# Patient Record
Sex: Female | Born: 1950 | Race: White | Hispanic: No | Marital: Married | Smoking: Current every day smoker
Health system: Southern US, Community
[De-identification: ages and names within clinical notes are randomized; demographics above are authoritative.]

## PROBLEM LIST (undated history)

## (undated) DIAGNOSIS — C801 Malignant (primary) neoplasm, unspecified: Secondary | ICD-10-CM

## (undated) HISTORY — PX: BREAST SURGERY: SHX581

---

## 2012-09-06 ENCOUNTER — Encounter (HOSPITAL_COMMUNITY): Payer: Self-pay | Admitting: Emergency Medicine

## 2012-09-06 ENCOUNTER — Emergency Department (HOSPITAL_COMMUNITY)
Admission: EM | Admit: 2012-09-06 | Discharge: 2012-09-06 | Disposition: A | Discharge status: Home / Self Care | Payer: Self-pay | Attending: Emergency Medicine | Admitting: Emergency Medicine

## 2012-09-06 DIAGNOSIS — G479 Sleep disorder, unspecified: Secondary | ICD-10-CM | POA: Insufficient documentation

## 2012-09-06 DIAGNOSIS — G44209 Tension-type headache, unspecified, not intractable: Secondary | ICD-10-CM | POA: Insufficient documentation

## 2012-09-06 DIAGNOSIS — F17200 Nicotine dependence, unspecified, uncomplicated: Secondary | ICD-10-CM | POA: Insufficient documentation

## 2012-09-06 DIAGNOSIS — B369 Superficial mycosis, unspecified: Secondary | ICD-10-CM | POA: Insufficient documentation

## 2012-09-06 DIAGNOSIS — R5383 Other fatigue: Secondary | ICD-10-CM | POA: Insufficient documentation

## 2012-09-06 DIAGNOSIS — F419 Anxiety disorder, unspecified: Secondary | ICD-10-CM

## 2012-09-06 DIAGNOSIS — F410 Panic disorder [episodic paroxysmal anxiety] without agoraphobia: Secondary | ICD-10-CM | POA: Insufficient documentation

## 2012-09-06 DIAGNOSIS — R5381 Other malaise: Secondary | ICD-10-CM | POA: Insufficient documentation

## 2012-09-06 MED ORDER — NYSTATIN 100000 UNIT/GM EX CREA: TOPICAL_CREAM | CUTANEOUS | Status: DC

## 2012-09-06 MED ORDER — CLONAZEPAM 0.5 MG PO TABS
0.5000 mg | ORAL_TABLET | Freq: Once | ORAL | Status: AC
  Administered 2012-09-06: 0.5 mg via ORAL
  Filled 2012-09-06: qty 1

## 2012-09-06 MED ORDER — CLONAZEPAM 0.5 MG PO TABS: 0.5000 mg | ORAL_TABLET | Freq: Two times a day (BID) | ORAL | Status: AC | PRN

## 2012-09-06 NOTE — ED Provider Notes (Signed)
Medical screening examination/treatment/procedure(s) were performed by non-physician practitioner and as supervising physician I was immediately available for consultation/collaboration.  Gilda Crease, MD 09/06/12 2022

## 2012-09-06 NOTE — ED Notes (Signed)
Pt to ED tearful, st's she hasn't slept in 3 nights.  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  Has not been eating.  Pt also has a growth on left breast that has been draining foul smelling drainage.  St's she has not been to the doctor because she doesn't have insurance.  Pt st's she feels like she is having a nervous breakdown. St's all she does is cry.

## 2012-09-06 NOTE — ED Provider Notes (Signed)
History     CSN: [id number]  Arrival date & time 09/06/12  1632   First MD Initiated Contact with Patient 09/06/12 1823      Chief Complaint  Patient presents with  . Anxiety    (Consider location/radiation/quality/duration/timing/severity/associated sxs/prior treatment) HPI Comments: 62 y/o female presents to the ED with one of her daughters complaining of worsening anxiety and insomnia since Christmas eve. Has not slept for last 3 days. 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 Complains of "tension all over", stomach in "knots", lack of appetite, frequent crying, and tension headaches. Reported 6 pound weight loss over the last 2 months.  Patient did text her daughter today that she could not go on living with the insomnia, but had no apparent plan to harm herself or others.  Denies thoughts of suicide, states she is just overwhelmed with lift. Patient has been taking Z-quil, a half dose nightly with no relief. She does not have a PCP due to insurance reasons. Has never been diagnosed with anxiety in the past. Also complaining of a rash under her right breast that has been present on and off for many years. She never had this checked out. States it is occasionally moist, but does not irritate her or itch.  Patient is a 62 y.o. female presenting with anxiety. The history is provided by the patient and a relative.  Anxiety Associated symptoms include fatigue and a rash.    History reviewed. No pertinent past medical history.  Past Surgical History  Procedure Laterality Date  . Breast surgery      No family history on file.  History  Substance Use Topics  . Smoking status: Current Every Day Smoker  . Smokeless tobacco: Not on file  . Alcohol Use: No    OB History   Grav Para Term Preterm Abortions TAB SAB Ect Mult Living                  Review of Systems  Constitutional: Positive for activity change, appetite change  and fatigue.  Skin: Positive for rash.  Psychiatric/Behavioral: Positive for sleep disturbance. Negative for suicidal ideas and self-injury. The patient is nervous/anxious.   All other systems reviewed and are negative.    Allergies  Review of patient's allergies indicates no known allergies.  Home Medications  No current outpatient prescriptions on file.  BP 159/94  Pulse 97  Temp(Src) 98.4 F (36.9 C) (Oral)  Resp 22  SpO2 98%  Physical Exam  Nursing note and vitals reviewed. Constitutional: She is oriented to person, place, and time. She appears well-developed and well-nourished. No distress.  HENT:  Head: Normocephalic and atraumatic.  Mouth/Throat: Oropharynx is clear and moist.  Eyes: Conjunctivae and EOM are normal. Pupils are equal, round, and reactive to light.  Neck: Normal range of motion. Neck supple.  Cardiovascular: Regular rhythm, normal heart sounds and intact distal pulses.  Tachycardia present.   Pulmonary/Chest: Effort normal and breath sounds normal. No respiratory distress.  Abdominal: Soft. Bowel sounds are normal. There is no tenderness.  Musculoskeletal: Normal range of motion. She exhibits no edema.  Neurological: She is alert and oriented to person, place, and time.  Skin: Skin is warm and dry.     Psychiatric: Her speech is normal and behavior is normal. Judgment normal. Her mood appears anxious. Cognition and memory are normal. She exhibits a depressed mood. She expresses no homicidal and no suicidal ideation. She expresses no  suicidal plans and no homicidal plans.    ED Course  Procedures (including critical care time)  Labs Reviewed - No data to display No results found.   1. Anxiety   2. Panic attack   3. Fungal infection of skin       MDM  62 y/o female with anxiety and panic attack. She is not suicidal or homicidal. Rx klonopin and she will f/u with PCP on resource guide. Daughter states she will make sure patient calls a clinic.  Rx nystatin cream for rash under breast. Return precautions discussed. Patient stable for discharge. Patient and daughter state understanding of plan and are agreeable.        Trevor Mace, PA-C 09/06/12 [phone]

## 2012-09-06 NOTE — ED Notes (Signed)
Pt undressed, in gown, on monitor, continuous pulse oximetry and blood pressure cuff; family at bedside 

## 2016-05-12 DIAGNOSIS — F064 Anxiety disorder due to known physiological condition: Secondary | ICD-10-CM | POA: Diagnosis not present

## 2016-05-12 DIAGNOSIS — Z79899 Other long term (current) drug therapy: Secondary | ICD-10-CM | POA: Diagnosis not present

## 2016-05-12 DIAGNOSIS — Z6823 Body mass index (BMI) 23.0-23.9, adult: Secondary | ICD-10-CM | POA: Diagnosis not present

## 2016-05-12 DIAGNOSIS — R030 Elevated blood-pressure reading, without diagnosis of hypertension: Secondary | ICD-10-CM | POA: Diagnosis not present

## 2016-06-05 DIAGNOSIS — Z6823 Body mass index (BMI) 23.0-23.9, adult: Secondary | ICD-10-CM | POA: Diagnosis not present

## 2016-06-05 DIAGNOSIS — F064 Anxiety disorder due to known physiological condition: Secondary | ICD-10-CM | POA: Diagnosis not present

## 2016-06-05 DIAGNOSIS — R030 Elevated blood-pressure reading, without diagnosis of hypertension: Secondary | ICD-10-CM | POA: Diagnosis not present

## 2016-07-07 DIAGNOSIS — F064 Anxiety disorder due to known physiological condition: Secondary | ICD-10-CM | POA: Diagnosis not present

## 2016-08-11 DIAGNOSIS — B373 Candidiasis of vulva and vagina: Secondary | ICD-10-CM | POA: Diagnosis not present

## 2016-08-18 DIAGNOSIS — D242 Benign neoplasm of left breast: Secondary | ICD-10-CM | POA: Diagnosis not present

## 2016-08-18 DIAGNOSIS — Z803 Family history of malignant neoplasm of breast: Secondary | ICD-10-CM | POA: Diagnosis not present

## 2016-08-18 DIAGNOSIS — N6322 Unspecified lump in the left breast, upper inner quadrant: Secondary | ICD-10-CM | POA: Diagnosis not present

## 2016-08-25 DIAGNOSIS — C44509 Unspecified malignant neoplasm of skin of other part of trunk: Secondary | ICD-10-CM | POA: Diagnosis not present

## 2016-08-25 DIAGNOSIS — D485 Neoplasm of uncertain behavior of skin: Secondary | ICD-10-CM | POA: Diagnosis not present

## 2016-10-06 DIAGNOSIS — Z0000 Encounter for general adult medical examination without abnormal findings: Secondary | ICD-10-CM | POA: Diagnosis not present

## 2016-10-06 DIAGNOSIS — Z136 Encounter for screening for cardiovascular disorders: Secondary | ICD-10-CM | POA: Diagnosis not present

## 2016-10-06 DIAGNOSIS — Z131 Encounter for screening for diabetes mellitus: Secondary | ICD-10-CM | POA: Diagnosis not present

## 2016-10-06 DIAGNOSIS — Z7689 Persons encountering health services in other specified circumstances: Secondary | ICD-10-CM | POA: Diagnosis not present

## 2016-10-06 DIAGNOSIS — I10 Essential (primary) hypertension: Secondary | ICD-10-CM | POA: Diagnosis not present

## 2016-10-06 DIAGNOSIS — Z720 Tobacco use: Secondary | ICD-10-CM | POA: Diagnosis not present

## 2016-10-06 DIAGNOSIS — Z5181 Encounter for therapeutic drug level monitoring: Secondary | ICD-10-CM | POA: Diagnosis not present

## 2016-10-06 DIAGNOSIS — R69 Illness, unspecified: Secondary | ICD-10-CM | POA: Diagnosis not present

## 2016-10-20 DIAGNOSIS — Z720 Tobacco use: Secondary | ICD-10-CM | POA: Diagnosis not present

## 2016-10-20 DIAGNOSIS — Z0000 Encounter for general adult medical examination without abnormal findings: Secondary | ICD-10-CM | POA: Diagnosis not present

## 2016-10-20 DIAGNOSIS — R69 Illness, unspecified: Secondary | ICD-10-CM | POA: Diagnosis not present

## 2016-10-20 DIAGNOSIS — I10 Essential (primary) hypertension: Secondary | ICD-10-CM | POA: Diagnosis not present

## 2016-10-20 DIAGNOSIS — R7303 Prediabetes: Secondary | ICD-10-CM | POA: Diagnosis not present

## 2016-12-21 DIAGNOSIS — L309 Dermatitis, unspecified: Secondary | ICD-10-CM | POA: Diagnosis not present

## 2016-12-21 DIAGNOSIS — Z720 Tobacco use: Secondary | ICD-10-CM | POA: Diagnosis not present

## 2016-12-21 DIAGNOSIS — I10 Essential (primary) hypertension: Secondary | ICD-10-CM | POA: Diagnosis not present

## 2016-12-21 DIAGNOSIS — R69 Illness, unspecified: Secondary | ICD-10-CM | POA: Diagnosis not present

## 2017-03-05 DIAGNOSIS — I10 Essential (primary) hypertension: Secondary | ICD-10-CM | POA: Diagnosis not present

## 2017-03-05 DIAGNOSIS — R69 Illness, unspecified: Secondary | ICD-10-CM | POA: Diagnosis not present

## 2017-03-05 DIAGNOSIS — Z720 Tobacco use: Secondary | ICD-10-CM | POA: Diagnosis not present

## 2017-06-04 DIAGNOSIS — I10 Essential (primary) hypertension: Secondary | ICD-10-CM | POA: Diagnosis not present

## 2017-06-04 DIAGNOSIS — H6692 Otitis media, unspecified, left ear: Secondary | ICD-10-CM | POA: Diagnosis not present

## 2017-06-04 DIAGNOSIS — Z720 Tobacco use: Secondary | ICD-10-CM | POA: Diagnosis not present

## 2017-06-04 DIAGNOSIS — Z0000 Encounter for general adult medical examination without abnormal findings: Secondary | ICD-10-CM | POA: Diagnosis not present

## 2017-06-04 DIAGNOSIS — Z1389 Encounter for screening for other disorder: Secondary | ICD-10-CM | POA: Diagnosis not present

## 2017-06-04 DIAGNOSIS — H539 Unspecified visual disturbance: Secondary | ICD-10-CM | POA: Diagnosis not present

## 2017-06-04 DIAGNOSIS — R69 Illness, unspecified: Secondary | ICD-10-CM | POA: Diagnosis not present

## 2017-06-04 DIAGNOSIS — Z131 Encounter for screening for diabetes mellitus: Secondary | ICD-10-CM | POA: Diagnosis not present

## 2017-06-04 DIAGNOSIS — Z0110 Encounter for examination of ears and hearing without abnormal findings: Secondary | ICD-10-CM | POA: Diagnosis not present

## 2017-06-18 DIAGNOSIS — E875 Hyperkalemia: Secondary | ICD-10-CM | POA: Diagnosis not present

## 2017-08-27 DIAGNOSIS — R7303 Prediabetes: Secondary | ICD-10-CM | POA: Diagnosis not present

## 2017-08-27 DIAGNOSIS — E785 Hyperlipidemia, unspecified: Secondary | ICD-10-CM | POA: Diagnosis not present

## 2017-08-27 DIAGNOSIS — R69 Illness, unspecified: Secondary | ICD-10-CM | POA: Diagnosis not present

## 2017-08-27 DIAGNOSIS — I10 Essential (primary) hypertension: Secondary | ICD-10-CM | POA: Diagnosis not present

## 2017-08-27 DIAGNOSIS — Z5181 Encounter for therapeutic drug level monitoring: Secondary | ICD-10-CM | POA: Diagnosis not present

## 2017-08-27 DIAGNOSIS — H6691 Otitis media, unspecified, right ear: Secondary | ICD-10-CM | POA: Diagnosis not present

## 2017-08-27 DIAGNOSIS — Z720 Tobacco use: Secondary | ICD-10-CM | POA: Diagnosis not present

## 2017-10-27 DIAGNOSIS — E785 Hyperlipidemia, unspecified: Secondary | ICD-10-CM | POA: Diagnosis not present

## 2017-10-27 DIAGNOSIS — L0390 Cellulitis, unspecified: Secondary | ICD-10-CM | POA: Diagnosis not present

## 2017-10-27 DIAGNOSIS — Z0000 Encounter for general adult medical examination without abnormal findings: Secondary | ICD-10-CM | POA: Diagnosis not present

## 2017-10-27 DIAGNOSIS — R7303 Prediabetes: Secondary | ICD-10-CM | POA: Diagnosis not present

## 2017-10-27 DIAGNOSIS — I10 Essential (primary) hypertension: Secondary | ICD-10-CM | POA: Diagnosis not present

## 2017-10-27 DIAGNOSIS — Z720 Tobacco use: Secondary | ICD-10-CM | POA: Diagnosis not present

## 2017-10-27 DIAGNOSIS — R69 Illness, unspecified: Secondary | ICD-10-CM | POA: Diagnosis not present

## 2017-11-08 DIAGNOSIS — Z833 Family history of diabetes mellitus: Secondary | ICD-10-CM | POA: Diagnosis not present

## 2017-11-08 DIAGNOSIS — R030 Elevated blood-pressure reading, without diagnosis of hypertension: Secondary | ICD-10-CM | POA: Diagnosis not present

## 2017-11-08 DIAGNOSIS — F419 Anxiety disorder, unspecified: Secondary | ICD-10-CM | POA: Diagnosis not present

## 2017-11-08 DIAGNOSIS — Z720 Tobacco use: Secondary | ICD-10-CM | POA: Diagnosis not present

## 2017-11-08 DIAGNOSIS — R69 Illness, unspecified: Secondary | ICD-10-CM | POA: Diagnosis not present

## 2017-11-08 DIAGNOSIS — Z8249 Family history of ischemic heart disease and other diseases of the circulatory system: Secondary | ICD-10-CM | POA: Diagnosis not present

## 2017-11-23 DIAGNOSIS — I10 Essential (primary) hypertension: Secondary | ICD-10-CM | POA: Diagnosis not present

## 2017-11-23 DIAGNOSIS — E785 Hyperlipidemia, unspecified: Secondary | ICD-10-CM | POA: Diagnosis not present

## 2017-11-23 DIAGNOSIS — Z720 Tobacco use: Secondary | ICD-10-CM | POA: Diagnosis not present

## 2017-11-23 DIAGNOSIS — J069 Acute upper respiratory infection, unspecified: Secondary | ICD-10-CM | POA: Diagnosis not present

## 2017-11-23 DIAGNOSIS — R69 Illness, unspecified: Secondary | ICD-10-CM | POA: Diagnosis not present

## 2017-11-23 DIAGNOSIS — R7303 Prediabetes: Secondary | ICD-10-CM | POA: Diagnosis not present

## 2018-02-23 DIAGNOSIS — R69 Illness, unspecified: Secondary | ICD-10-CM | POA: Diagnosis not present

## 2018-02-23 DIAGNOSIS — R7303 Prediabetes: Secondary | ICD-10-CM | POA: Diagnosis not present

## 2018-02-23 DIAGNOSIS — E785 Hyperlipidemia, unspecified: Secondary | ICD-10-CM | POA: Diagnosis not present

## 2018-02-23 DIAGNOSIS — I10 Essential (primary) hypertension: Secondary | ICD-10-CM | POA: Diagnosis not present

## 2018-02-23 DIAGNOSIS — Z720 Tobacco use: Secondary | ICD-10-CM | POA: Diagnosis not present

## 2018-02-23 DIAGNOSIS — M62830 Muscle spasm of back: Secondary | ICD-10-CM | POA: Diagnosis not present

## 2018-05-25 DIAGNOSIS — M62830 Muscle spasm of back: Secondary | ICD-10-CM | POA: Diagnosis not present

## 2018-05-25 DIAGNOSIS — R7303 Prediabetes: Secondary | ICD-10-CM | POA: Diagnosis not present

## 2018-05-25 DIAGNOSIS — B372 Candidiasis of skin and nail: Secondary | ICD-10-CM | POA: Diagnosis not present

## 2018-05-25 DIAGNOSIS — Z0000 Encounter for general adult medical examination without abnormal findings: Secondary | ICD-10-CM | POA: Diagnosis not present

## 2018-05-25 DIAGNOSIS — Z720 Tobacco use: Secondary | ICD-10-CM | POA: Diagnosis not present

## 2018-05-25 DIAGNOSIS — Z0100 Encounter for examination of eyes and vision without abnormal findings: Secondary | ICD-10-CM | POA: Diagnosis not present

## 2018-05-25 DIAGNOSIS — E785 Hyperlipidemia, unspecified: Secondary | ICD-10-CM | POA: Diagnosis not present

## 2018-05-25 DIAGNOSIS — Z131 Encounter for screening for diabetes mellitus: Secondary | ICD-10-CM | POA: Diagnosis not present

## 2018-05-25 DIAGNOSIS — I10 Essential (primary) hypertension: Secondary | ICD-10-CM | POA: Diagnosis not present

## 2018-05-25 DIAGNOSIS — R69 Illness, unspecified: Secondary | ICD-10-CM | POA: Diagnosis not present

## 2018-06-03 DIAGNOSIS — Z720 Tobacco use: Secondary | ICD-10-CM | POA: Diagnosis not present

## 2018-06-03 DIAGNOSIS — R7303 Prediabetes: Secondary | ICD-10-CM | POA: Diagnosis not present

## 2018-06-03 DIAGNOSIS — B372 Candidiasis of skin and nail: Secondary | ICD-10-CM | POA: Diagnosis not present

## 2018-06-03 DIAGNOSIS — M62830 Muscle spasm of back: Secondary | ICD-10-CM | POA: Diagnosis not present

## 2018-06-03 DIAGNOSIS — E785 Hyperlipidemia, unspecified: Secondary | ICD-10-CM | POA: Diagnosis not present

## 2018-06-03 DIAGNOSIS — I10 Essential (primary) hypertension: Secondary | ICD-10-CM | POA: Diagnosis not present

## 2018-06-03 DIAGNOSIS — R69 Illness, unspecified: Secondary | ICD-10-CM | POA: Diagnosis not present

## 2018-06-28 DIAGNOSIS — Z720 Tobacco use: Secondary | ICD-10-CM | POA: Diagnosis not present

## 2018-06-28 DIAGNOSIS — R7303 Prediabetes: Secondary | ICD-10-CM | POA: Diagnosis not present

## 2018-06-28 DIAGNOSIS — B372 Candidiasis of skin and nail: Secondary | ICD-10-CM | POA: Diagnosis not present

## 2018-06-28 DIAGNOSIS — M62830 Muscle spasm of back: Secondary | ICD-10-CM | POA: Diagnosis not present

## 2018-06-28 DIAGNOSIS — I10 Essential (primary) hypertension: Secondary | ICD-10-CM | POA: Diagnosis not present

## 2018-06-28 DIAGNOSIS — R69 Illness, unspecified: Secondary | ICD-10-CM | POA: Diagnosis not present

## 2018-06-28 DIAGNOSIS — E785 Hyperlipidemia, unspecified: Secondary | ICD-10-CM | POA: Diagnosis not present

## 2018-08-01 DIAGNOSIS — R7303 Prediabetes: Secondary | ICD-10-CM | POA: Diagnosis not present

## 2018-08-01 DIAGNOSIS — E785 Hyperlipidemia, unspecified: Secondary | ICD-10-CM | POA: Diagnosis not present

## 2018-08-01 DIAGNOSIS — B372 Candidiasis of skin and nail: Secondary | ICD-10-CM | POA: Diagnosis not present

## 2018-08-01 DIAGNOSIS — I10 Essential (primary) hypertension: Secondary | ICD-10-CM | POA: Diagnosis not present

## 2018-08-01 DIAGNOSIS — M62830 Muscle spasm of back: Secondary | ICD-10-CM | POA: Diagnosis not present

## 2018-08-01 DIAGNOSIS — R69 Illness, unspecified: Secondary | ICD-10-CM | POA: Diagnosis not present

## 2018-08-01 DIAGNOSIS — Z720 Tobacco use: Secondary | ICD-10-CM | POA: Diagnosis not present

## 2018-08-24 DIAGNOSIS — I10 Essential (primary) hypertension: Secondary | ICD-10-CM | POA: Diagnosis not present

## 2018-08-24 DIAGNOSIS — R7303 Prediabetes: Secondary | ICD-10-CM | POA: Diagnosis not present

## 2018-08-24 DIAGNOSIS — M62830 Muscle spasm of back: Secondary | ICD-10-CM | POA: Diagnosis not present

## 2018-08-24 DIAGNOSIS — E785 Hyperlipidemia, unspecified: Secondary | ICD-10-CM | POA: Diagnosis not present

## 2018-08-24 DIAGNOSIS — Z720 Tobacco use: Secondary | ICD-10-CM | POA: Diagnosis not present

## 2018-08-24 DIAGNOSIS — R69 Illness, unspecified: Secondary | ICD-10-CM | POA: Diagnosis not present

## 2018-08-24 DIAGNOSIS — B372 Candidiasis of skin and nail: Secondary | ICD-10-CM | POA: Diagnosis not present

## 2018-11-23 DIAGNOSIS — Z720 Tobacco use: Secondary | ICD-10-CM | POA: Diagnosis not present

## 2018-11-23 DIAGNOSIS — B372 Candidiasis of skin and nail: Secondary | ICD-10-CM | POA: Diagnosis not present

## 2018-11-23 DIAGNOSIS — I10 Essential (primary) hypertension: Secondary | ICD-10-CM | POA: Diagnosis not present

## 2018-11-23 DIAGNOSIS — R7303 Prediabetes: Secondary | ICD-10-CM | POA: Diagnosis not present

## 2018-11-23 DIAGNOSIS — E785 Hyperlipidemia, unspecified: Secondary | ICD-10-CM | POA: Diagnosis not present

## 2018-11-23 DIAGNOSIS — M62830 Muscle spasm of back: Secondary | ICD-10-CM | POA: Diagnosis not present

## 2018-11-23 DIAGNOSIS — R69 Illness, unspecified: Secondary | ICD-10-CM | POA: Diagnosis not present

## 2019-02-24 DIAGNOSIS — Z720 Tobacco use: Secondary | ICD-10-CM | POA: Diagnosis not present

## 2019-02-24 DIAGNOSIS — Z1389 Encounter for screening for other disorder: Secondary | ICD-10-CM | POA: Diagnosis not present

## 2019-02-24 DIAGNOSIS — R69 Illness, unspecified: Secondary | ICD-10-CM | POA: Diagnosis not present

## 2019-02-24 DIAGNOSIS — Z0001 Encounter for general adult medical examination with abnormal findings: Secondary | ICD-10-CM | POA: Diagnosis not present

## 2019-02-24 DIAGNOSIS — I10 Essential (primary) hypertension: Secondary | ICD-10-CM | POA: Diagnosis not present

## 2019-02-24 DIAGNOSIS — E785 Hyperlipidemia, unspecified: Secondary | ICD-10-CM | POA: Diagnosis not present

## 2019-02-24 DIAGNOSIS — M62830 Muscle spasm of back: Secondary | ICD-10-CM | POA: Diagnosis not present

## 2019-02-24 DIAGNOSIS — R7303 Prediabetes: Secondary | ICD-10-CM | POA: Diagnosis not present

## 2019-03-28 DIAGNOSIS — R21 Rash and other nonspecific skin eruption: Secondary | ICD-10-CM | POA: Diagnosis not present

## 2019-03-28 DIAGNOSIS — I10 Essential (primary) hypertension: Secondary | ICD-10-CM | POA: Diagnosis not present

## 2019-03-28 DIAGNOSIS — R7303 Prediabetes: Secondary | ICD-10-CM | POA: Diagnosis not present

## 2019-03-28 DIAGNOSIS — Z720 Tobacco use: Secondary | ICD-10-CM | POA: Diagnosis not present

## 2019-03-28 DIAGNOSIS — M62830 Muscle spasm of back: Secondary | ICD-10-CM | POA: Diagnosis not present

## 2019-03-28 DIAGNOSIS — R69 Illness, unspecified: Secondary | ICD-10-CM | POA: Diagnosis not present

## 2019-03-28 DIAGNOSIS — Z1389 Encounter for screening for other disorder: Secondary | ICD-10-CM | POA: Diagnosis not present

## 2019-03-28 DIAGNOSIS — E785 Hyperlipidemia, unspecified: Secondary | ICD-10-CM | POA: Diagnosis not present

## 2019-05-30 DIAGNOSIS — R7303 Prediabetes: Secondary | ICD-10-CM | POA: Diagnosis not present

## 2019-05-30 DIAGNOSIS — Z720 Tobacco use: Secondary | ICD-10-CM | POA: Diagnosis not present

## 2019-05-30 DIAGNOSIS — M62830 Muscle spasm of back: Secondary | ICD-10-CM | POA: Diagnosis not present

## 2019-05-30 DIAGNOSIS — E785 Hyperlipidemia, unspecified: Secondary | ICD-10-CM | POA: Diagnosis not present

## 2019-05-30 DIAGNOSIS — R69 Illness, unspecified: Secondary | ICD-10-CM | POA: Diagnosis not present

## 2019-05-30 DIAGNOSIS — I10 Essential (primary) hypertension: Secondary | ICD-10-CM | POA: Diagnosis not present

## 2019-07-04 DIAGNOSIS — I10 Essential (primary) hypertension: Secondary | ICD-10-CM | POA: Diagnosis not present

## 2019-07-04 DIAGNOSIS — Z720 Tobacco use: Secondary | ICD-10-CM | POA: Diagnosis not present

## 2019-07-04 DIAGNOSIS — R7303 Prediabetes: Secondary | ICD-10-CM | POA: Diagnosis not present

## 2019-07-04 DIAGNOSIS — M5441 Lumbago with sciatica, right side: Secondary | ICD-10-CM | POA: Diagnosis not present

## 2019-07-04 DIAGNOSIS — E785 Hyperlipidemia, unspecified: Secondary | ICD-10-CM | POA: Diagnosis not present

## 2019-07-04 DIAGNOSIS — R69 Illness, unspecified: Secondary | ICD-10-CM | POA: Diagnosis not present

## 2019-07-04 DIAGNOSIS — G8929 Other chronic pain: Secondary | ICD-10-CM | POA: Diagnosis not present

## 2019-07-04 DIAGNOSIS — M5442 Lumbago with sciatica, left side: Secondary | ICD-10-CM | POA: Diagnosis not present

## 2019-07-04 DIAGNOSIS — M62830 Muscle spasm of back: Secondary | ICD-10-CM | POA: Diagnosis not present

## 2019-07-06 ENCOUNTER — Other Ambulatory Visit: Payer: Self-pay | Admitting: Physician Assistant

## 2019-07-06 DIAGNOSIS — R69 Illness, unspecified: Secondary | ICD-10-CM | POA: Diagnosis not present

## 2019-07-06 DIAGNOSIS — M549 Dorsalgia, unspecified: Secondary | ICD-10-CM

## 2019-07-06 DIAGNOSIS — E785 Hyperlipidemia, unspecified: Secondary | ICD-10-CM | POA: Diagnosis not present

## 2019-07-06 DIAGNOSIS — M62830 Muscle spasm of back: Secondary | ICD-10-CM | POA: Diagnosis not present

## 2019-07-06 DIAGNOSIS — R7303 Prediabetes: Secondary | ICD-10-CM | POA: Diagnosis not present

## 2019-07-06 DIAGNOSIS — M5442 Lumbago with sciatica, left side: Secondary | ICD-10-CM | POA: Diagnosis not present

## 2019-07-06 DIAGNOSIS — Z720 Tobacco use: Secondary | ICD-10-CM | POA: Diagnosis not present

## 2019-07-06 DIAGNOSIS — M5441 Lumbago with sciatica, right side: Secondary | ICD-10-CM | POA: Diagnosis not present

## 2019-07-06 DIAGNOSIS — I10 Essential (primary) hypertension: Secondary | ICD-10-CM | POA: Diagnosis not present

## 2019-07-06 DIAGNOSIS — G8929 Other chronic pain: Secondary | ICD-10-CM | POA: Diagnosis not present

## 2019-10-15 ENCOUNTER — Emergency Department (HOSPITAL_COMMUNITY): Payer: Medicare HMO

## 2019-10-15 ENCOUNTER — Inpatient Hospital Stay (HOSPITAL_COMMUNITY)
Admission: EM | Admit: 2019-10-15 | Discharge: 2019-10-18 | DRG: 377 | Disposition: A | Discharge status: Hospice | Payer: Medicare HMO | Attending: Family Medicine | Admitting: Family Medicine

## 2019-10-15 ENCOUNTER — Other Ambulatory Visit: Payer: Self-pay

## 2019-10-15 ENCOUNTER — Encounter (HOSPITAL_COMMUNITY): Payer: Self-pay

## 2019-10-15 DIAGNOSIS — E876 Hypokalemia: Secondary | ICD-10-CM | POA: Diagnosis present

## 2019-10-15 DIAGNOSIS — E43 Unspecified severe protein-calorie malnutrition: Secondary | ICD-10-CM | POA: Insufficient documentation

## 2019-10-15 DIAGNOSIS — K922 Gastrointestinal hemorrhage, unspecified: Secondary | ICD-10-CM | POA: Diagnosis not present

## 2019-10-15 DIAGNOSIS — R0902 Hypoxemia: Secondary | ICD-10-CM | POA: Diagnosis not present

## 2019-10-15 DIAGNOSIS — R64 Cachexia: Secondary | ICD-10-CM | POA: Diagnosis present

## 2019-10-15 DIAGNOSIS — Z515 Encounter for palliative care: Secondary | ICD-10-CM

## 2019-10-15 DIAGNOSIS — J90 Pleural effusion, not elsewhere classified: Secondary | ICD-10-CM

## 2019-10-15 DIAGNOSIS — D649 Anemia, unspecified: Secondary | ICD-10-CM

## 2019-10-15 DIAGNOSIS — Z66 Do not resuscitate: Secondary | ICD-10-CM | POA: Diagnosis not present

## 2019-10-15 DIAGNOSIS — D62 Acute posthemorrhagic anemia: Secondary | ICD-10-CM | POA: Diagnosis not present

## 2019-10-15 DIAGNOSIS — R404 Transient alteration of awareness: Secondary | ICD-10-CM | POA: Diagnosis not present

## 2019-10-15 DIAGNOSIS — R58 Hemorrhage, not elsewhere classified: Secondary | ICD-10-CM | POA: Diagnosis not present

## 2019-10-15 DIAGNOSIS — C7951 Secondary malignant neoplasm of bone: Secondary | ICD-10-CM | POA: Diagnosis present

## 2019-10-15 DIAGNOSIS — C799 Secondary malignant neoplasm of unspecified site: Secondary | ICD-10-CM

## 2019-10-15 DIAGNOSIS — Z853 Personal history of malignant neoplasm of breast: Secondary | ICD-10-CM | POA: Diagnosis not present

## 2019-10-15 DIAGNOSIS — Z681 Body mass index (BMI) 19 or less, adult: Secondary | ICD-10-CM | POA: Diagnosis not present

## 2019-10-15 DIAGNOSIS — Z20822 Contact with and (suspected) exposure to covid-19: Secondary | ICD-10-CM | POA: Diagnosis present

## 2019-10-15 DIAGNOSIS — J9601 Acute respiratory failure with hypoxia: Secondary | ICD-10-CM | POA: Diagnosis not present

## 2019-10-15 DIAGNOSIS — Z79899 Other long term (current) drug therapy: Secondary | ICD-10-CM | POA: Diagnosis not present

## 2019-10-15 DIAGNOSIS — R627 Adult failure to thrive: Secondary | ICD-10-CM | POA: Diagnosis present

## 2019-10-15 DIAGNOSIS — C50919 Malignant neoplasm of unspecified site of unspecified female breast: Secondary | ICD-10-CM | POA: Diagnosis present

## 2019-10-15 DIAGNOSIS — K921 Melena: Secondary | ICD-10-CM | POA: Diagnosis not present

## 2019-10-15 DIAGNOSIS — R1084 Generalized abdominal pain: Secondary | ICD-10-CM | POA: Diagnosis not present

## 2019-10-15 DIAGNOSIS — C7981 Secondary malignant neoplasm of breast: Secondary | ICD-10-CM | POA: Diagnosis not present

## 2019-10-15 DIAGNOSIS — R52 Pain, unspecified: Secondary | ICD-10-CM | POA: Diagnosis not present

## 2019-10-15 HISTORY — DX: Malignant (primary) neoplasm, unspecified: C80.1

## 2019-10-15 LAB — COMPREHENSIVE METABOLIC PANEL
ALT: 13 U/L (ref 0–44)
AST: 31 U/L (ref 15–41)
Albumin: 2.3 g/dL — ABNORMAL LOW (ref 3.5–5.0)
Alkaline Phosphatase: 92 U/L (ref 38–126)
Anion gap: 16 — ABNORMAL HIGH (ref 5–15)
BUN: 20 mg/dL (ref 8–23)
CO2: 28 mmol/L (ref 22–32)
Calcium: 7.9 mg/dL — ABNORMAL LOW (ref 8.9–10.3)
Chloride: 92 mmol/L — ABNORMAL LOW (ref 98–111)
Creatinine, Ser: 0.54 mg/dL (ref 0.44–1.00)
GFR calc Af Amer: >60 mL/min (ref >60)
GFR calc non Af Amer: >60 mL/min (ref >60)
Glucose, Bld: 125 mg/dL — ABNORMAL HIGH (ref 70–99)
Potassium: 3.3 mmol/L — ABNORMAL LOW (ref 3.5–5.1)
Sodium: 136 mmol/L (ref 135–145)
Total Bilirubin: 0.6 mg/dL (ref 0.3–1.2)
Total Protein: 6 g/dL — ABNORMAL LOW (ref 6.5–8.1)

## 2019-10-15 LAB — LIPASE, BLOOD: Lipase: 34 U/L (ref 11–51)

## 2019-10-15 LAB — CBC WITH DIFFERENTIAL/PLATELET
Abs Immature Granulocytes: 0.05 10*3/uL (ref 0.00–0.07)
Basophils Absolute: 0 10*3/uL (ref 0.0–0.1)
Basophils Relative: 0 %
Eosinophils Absolute: 0 10*3/uL (ref 0.0–0.5)
Eosinophils Relative: 0 %
HCT: 25.2 % — ABNORMAL LOW (ref 36.0–46.0)
Hemoglobin: 7.2 g/dL — ABNORMAL LOW (ref 12.0–15.0)
Immature Granulocytes: 1 %
Lymphocytes Relative: 10 %
Lymphs Abs: 0.7 10*3/uL (ref 0.7–4.0)
MCH: 26.9 pg (ref 26.0–34.0)
MCHC: 28.6 g/dL — ABNORMAL LOW (ref 30.0–36.0)
MCV: 94 fL (ref 80.0–100.0)
Monocytes Absolute: 0.1 10*3/uL (ref 0.1–1.0)
Monocytes Relative: 2 %
Neutro Abs: 6.3 10*3/uL (ref 1.7–7.7)
Neutrophils Relative %: 87 %
Platelets: 505 10*3/uL — ABNORMAL HIGH (ref 150–400)
RBC: 2.68 MIL/uL — ABNORMAL LOW (ref 3.87–5.11)
RDW: 19.9 % — ABNORMAL HIGH (ref 11.5–15.5)
WBC: 7.2 10*3/uL (ref 4.0–10.5)
nRBC: 0 % (ref 0.0–0.2)

## 2019-10-15 LAB — PREPARE RBC (CROSSMATCH)

## 2019-10-15 MED ORDER — SODIUM CHLORIDE 0.9 % IV SOLN: INTRAVENOUS | Status: AC

## 2019-10-15 MED ORDER — ACETAMINOPHEN 650 MG RE SUPP: 650.0000 mg | Freq: Four times a day (QID) | RECTAL | Status: DC | PRN

## 2019-10-15 MED ORDER — STERILE WATER FOR INJECTION IJ SOLN
INTRAMUSCULAR | Status: AC
  Administered 2019-10-15: 10 mL
  Filled 2019-10-15: qty 10

## 2019-10-15 MED ORDER — PANTOPRAZOLE SODIUM 40 MG IV SOLR
40.0000 mg | Freq: Once | INTRAVENOUS | Status: AC
  Administered 2019-10-15: 40 mg via INTRAVENOUS
  Filled 2019-10-15: qty 40

## 2019-10-15 MED ORDER — SODIUM CHLORIDE 0.9 % IV SOLN
10.0000 mL/h | Freq: Once | INTRAVENOUS | Status: AC
  Administered 2019-10-16: 10 mL/h via INTRAVENOUS

## 2019-10-15 MED ORDER — PANTOPRAZOLE SODIUM 40 MG IV SOLR
40.0000 mg | Freq: Two times a day (BID) | INTRAVENOUS | Status: DC
  Administered 2019-10-16 – 2019-10-17 (×3): 40 mg via INTRAVENOUS
  Filled 2019-10-15 (×4): qty 40

## 2019-10-15 MED ORDER — ACETAMINOPHEN 325 MG PO TABS: 650.0000 mg | ORAL_TABLET | Freq: Four times a day (QID) | ORAL | Status: DC | PRN

## 2019-10-15 MED ORDER — POTASSIUM CHLORIDE CRYS ER 20 MEQ PO TBCR: 30.0000 meq | EXTENDED_RELEASE_TABLET | Freq: Once | ORAL | Status: DC

## 2019-10-15 MED ORDER — IOHEXOL 350 MG/ML SOLN
100.0000 mL | Freq: Once | INTRAVENOUS | Status: AC | PRN
  Administered 2019-10-15: 100 mL via INTRAVENOUS

## 2019-10-15 MED ORDER — SODIUM CHLORIDE (PF) 0.9 % IJ SOLN
INTRAMUSCULAR | Status: AC
  Filled 2019-10-15: qty 50

## 2019-10-15 NOTE — ED Notes (Signed)
Dr. Marlowe Sax at bedside assessing pt.

## 2019-10-15 NOTE — ED Provider Notes (Signed)
[facility] DEPT Provider Note   CSN: [id number] Arrival date & time: 10/15/19  1756     History Chief Complaint  Patient presents with  . Rectal Bleeding    Teresa Hull is a 69 y.o. female.  The history is provided by the patient and the spouse. No language interpreter was used.  Rectal Bleeding    69 year old female brought here via EMS from home for evaluation of lethargy and dark-colored stool.  History obtained through patient and through husband who is at bedside.  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  She opted not to receive chemo or radiation.  She has not been follow-up with her doctor much lately.  For the past several month there has been a steady decline in her health, she is losing weight rapidly.  She has no appetite, endorsed nausea without vomiting and for the past week she has had recurrent dark tarry stool.  Today she had a syncopal episode while sitting on the toilet.  Patient states she feels very weak and dehydrated.  She denies any NSAID use.  She endorsed shortness of breath with exertion.  She report not having any strength.  She is a full code.  Was on antibiotic several months prior but has been on antibiotic for the past 2 to 3 months.  She is a smoker and smoked approximately half a pack a day.  She denies alcohol use.  Denies any cough fever or chills.  No recent Covid exposure  No past medical history on file.  There are no problems to display for this patient.   Past Surgical History:  Procedure Laterality Date  . BREAST SURGERY       OB History   No obstetric history on file.     No family history on file.  Social History   Tobacco Use  . Smoking status: Current Every Day Smoker  Substance Use Topics  . Alcohol use: No  . Drug use: No    Home Medications Prior to Admission medications   Medication Sig Start Date End Date Taking?  Authorizing Provider  clonazePAM (KLONOPIN) 0.5 MG tablet Take 1 tablet (0.5 mg total) by mouth 2 (two) times daily as needed for anxiety. 09/06/12   Hess, Hessie Diener, PA-C  nystatin cream (MYCOSTATIN) Apply to affected area 2 times daily 09/06/12   Hess, Hessie Diener, PA-C    Allergies    Patient has no known allergies.  Review of Systems   Review of Systems  Gastrointestinal: Positive for hematochezia.  All other systems reviewed and are negative.   Physical Exam Updated Vital Signs BP (!) 110/53   Pulse 97   Temp (!) 96.3 F (35.7 C) (Rectal)   Resp 16   SpO2 96%   Physical Exam Vitals and nursing note reviewed.  Constitutional:      General: She is not in acute distress.    Appearance: She is well-developed. She is ill-appearing.     Comments: Elderly female who is frail and emaciated speaking slowly in soft voice and appears to be globally weak  HENT:     Head: Atraumatic.     Mouth/Throat:     Mouth: Mucous membranes are dry.  Eyes:     Conjunctiva/sclera: Conjunctivae normal.  Cardiovascular:     Rate and Rhythm: Normal rate and regular rhythm.     Pulses: Normal pulses.     Heart sounds:  Normal heart sounds.  Pulmonary:     Comments: Shallow breathing without overt rales rhonchi or wheezes Abdominal:     General: Abdomen is flat.     Palpations: Abdomen is soft.     Tenderness: There is abdominal tenderness (Abdomen is diffusely tender to palpation no obvious mass).  Genitourinary:    Comments: Chaperone present during exam.  Normal rectal tone, no obvious mass, melanotic stool on glove Musculoskeletal:        General: No deformity.     Cervical back: Neck supple.  Skin:    Findings: No rash.  Neurological:     General: No focal deficit present.     Mental Status: She is alert.  Psychiatric:        Mood and Affect: Mood normal.     ED Results / Procedures / Treatments   Labs (all labs ordered are listed, but only abnormal results are displayed) Labs  Reviewed  CBC WITH DIFFERENTIAL/PLATELET - Abnormal; Notable for the following components:      Result Value   RBC 2.68 (*)    Hemoglobin 7.2 (*)    HCT 25.2 (*)    MCHC 28.6 (*)    RDW 19.9 (*)    Platelets 505 (*)    All other components within normal limits  COMPREHENSIVE METABOLIC PANEL - Abnormal; Notable for the following components:   Potassium 3.3 (*)    Chloride 92 (*)    Glucose, Bld 125 (*)    Calcium 7.9 (*)    Total Protein 6.0 (*)    Albumin 2.3 (*)    Anion gap 16 (*)    All other components within normal limits  LIPASE, BLOOD  URINALYSIS, ROUTINE W REFLEX MICROSCOPIC  POC OCCULT BLOOD, ED  TYPE AND SCREEN  PREPARE RBC (CROSSMATCH)  ABO/RH    EKG None  Radiology DG Chest Portable 1 View  Result Date: 10/15/2019 CLINICAL DATA:  Lethargy, diarrhea, syncope EXAM: PORTABLE CHEST 1 VIEW COMPARISON:  None. FINDINGS: Moderate right pleural effusion. Left lung is clear. No pneumothorax. The heart is normal in size. IMPRESSION: Moderate right pleural effusion. Electronically Signed   By: Julian Hy M.D.   On: 10/15/2019 19:51   CT Angio Abd/Pel W and/or Wo Contrast  Result Date: 10/15/2019 CLINICAL DATA:  Gastrointestinal bleeding, dark stool, tobacco abuse, history of cancer EXAM: CTA ABDOMEN AND PELVIS WITHOUT AND WITH CONTRAST TECHNIQUE: Multidetector CT imaging of the abdomen and pelvis was performed using the standard protocol during bolus administration of intravenous contrast. Multiplanar reconstructed images and MIPs were obtained and reviewed to evaluate the vascular anatomy. CONTRAST:  170mL OMNIPAQUE IOHEXOL 350 MG/ML SOLN COMPARISON:  None. FINDINGS: VASCULAR Aorta: Normal caliber aorta without aneurysm, dissection, vasculitis or significant stenosis. Celiac: Patent without evidence of aneurysm, dissection, vasculitis or significant stenosis. SMA: Patent without evidence of aneurysm, dissection, vasculitis or significant stenosis. Incidental replaced  right hepatic artery off the SMA. Renals: Both renal arteries are patent without evidence of aneurysm, dissection, vasculitis, fibromuscular dysplasia or significant stenosis. IMA: Patent without evidence of aneurysm, dissection, vasculitis or significant stenosis. Inflow: Patent without evidence of aneurysm, dissection, vasculitis or significant stenosis. Proximal Outflow: Bilateral common femoral and visualized portions of the superficial and profunda femoral arteries are patent without evidence of aneurysm, dissection, vasculitis or significant stenosis. Veins: No obvious venous abnormality within the limitations of this arterial phase study. Review of the MIP images confirms the above findings. NON-VASCULAR Lower chest: There is a large right pleural effusion with  compressive atelectasis of the right middle and right lower lobes. Pleural nodularity is seen within the anterior right costophrenic angle. Emphysematous changes are seen at the left lung base. There is a spiculated nodule in the left lower lobe measuring 11 mm, compatible with primary or metastatic malignancy. Hepatobiliary: No focal liver abnormality is seen. Status post cholecystectomy. No biliary dilatation. Pancreas: Unremarkable. No pancreatic ductal dilatation or surrounding inflammatory changes. Spleen: Normal in size without focal abnormality. Adrenals/Urinary Tract: Adrenal glands are unremarkable. Kidneys are normal, without renal calculi, focal lesion, or hydronephrosis. Bladder is decompressed which limits evaluation. Stomach/Bowel: No bowel obstruction or ileus. I do not see any contrast accumulation within the bowel lumen to suggest active gastrointestinal bleeding. Lymphatic: No pathologic adenopathy. Reproductive: Uterus and bilateral adnexa are unremarkable. Other: The patient is markedly cachectic. Mild diffuse subcutaneous edema. There is trace ascites.  No free intraperitoneal gas. Musculoskeletal: There are extensive skeletal  metastases throughout all visualized bony structures, with numerous lytic lesions identified. Large destructive lesion with associated soft tissue mass seen at the lower sternal margin, soft tissue mass measuring 4.1 x 2.9 cm. IMPRESSION: VASCULAR 1. No evidence of active gastrointestinal bleeding. 2. Extensive atherosclerosis.  No aortic aneurysm or dissection. NON-VASCULAR 1. Extensive bony metastatic disease, with numerous destructive lytic lesions. Largest lesion with associated soft tissue mass at the lower sternal margin. 2. Large right pleural effusion, with associated pleural nodularity, consistent with metastatic disease. 3. Spiculated 11 mm left lower lobe pulmonary nodule, either metastatic or primary malignancy. 4. Marked cachexia. 5. Trace ascites. Electronically Signed   By: Randa Ngo M.D.   On: 10/15/2019 21:49    Procedures .Critical Care Performed by: Domenic Moras, PA-C Authorized by: Domenic Moras, PA-C   Critical care provider statement:    Critical care time (minutes):  50   Critical care was time spent personally by me on the following activities:  Discussions with consultants, evaluation of patient's response to treatment, examination of patient, ordering and performing treatments and interventions, ordering and review of laboratory studies, ordering and review of radiographic studies, pulse oximetry, re-evaluation of patient's condition, obtaining history from patient or surrogate and review of old charts   (including critical care time)  Medications Ordered in ED Medications  0.9 %  sodium chloride infusion (has no administration in time range)  pantoprazole (PROTONIX) injection 40 mg (40 mg Intravenous Given 10/15/19 2019)  sterile water (preservative free) injection (10 mLs  Given 10/15/19 2019)  sodium chloride (PF) 0.9 % injection (  Given by Other 10/15/19 2044)  iohexol (OMNIPAQUE) 350 MG/ML injection 100 mL (100 mLs Intravenous Contrast Given 10/15/19 2113)    ED  Course  I have reviewed the triage vital signs and the nursing notes.  Pertinent labs & imaging results that were available during my care of the patient were reviewed by me and considered in my medical decision making (see chart for details).    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  She is here due to progressive weight loss, generalized weakness, black tarry stool, and having shortness of breath.  Symptoms suggestive of symptomatic anemia from GI bleed, likely secondary to malignancy.  Work-up initiated.  Hemoglobin is 7.2, will transfuse blood product.  IV fluid given.  10:16 PM Abdominal pelvis CT scan demonstrate extensive bony metastasis disease with numerous destructive lytic lesion with the largest lesion with associated soft tissue mass at the lower sternal margin.  Large right pleural effusion consistent with metastatic disease.  Left lower lobe pulmonary nodule noted.  Trace ascites.  No active GI bleeding noted.  Will consult medicine for admission, will consult GI as well.  10:24 PM Appreciate consultation from GI specialist Dr. Foy Guadalajara who recommend NPO at midnight for scope tomorrow.  PPI BID.  I have ordered protonix as well as blood product.   10:56 PM Appreciate consultation from [facility] Dr. Marlowe Sax who agrees to see and admit pt for further management.   Teresa Hull was evaluated in Emergency Department on 10/15/2019 for the symptoms described in the history of present illness. She was evaluated  in the context of the global COVID-19 pandemic, which necessitated consideration that the patient might be at risk for infection with the SARS-CoV-2 virus that causes COVID-19. Institutional protocols and algorithms that pertain to the evaluation of patients at risk for COVID-19 are in a state of rapid change based on information released by regulatory bodies including the CDC and federal and state organizations. These policies and algorithms were followed during the patient's care in the ED.    Domenic Moras, PA-C 10/15/19 2257    Charlesetta Shanks, MD 10/25/19 [phone]

## 2019-10-15 NOTE — ED Triage Notes (Signed)
Arrived by EMS from home. Family reports lethargy, and tar colored diarrhea. Husband reports patient had syncopal episode on toilet today. Also reports decreased appetite and minimal response from pt. EMS reports cool extremities; placed on 10L NRB  250 mL 0.9% NS 100 mcg Fentanyl IV

## 2019-10-15 NOTE — H&P (Signed)
History and Physical    Teresa Hull [id number] DOB: 01-09-1951 DOA: 10/15/2019  PCP: Default, Provider, MD Patient coming from: Home  Chief Complaint: Dark tarry stool, syncope  HPI: Teresa Hull is a 69 y.o. female with a past medical history of breast cancer presenting to the ED for evaluation of dark tarry stool and syncope.  No family available at this time. Per ED provider's conversation with the patient's husband: "History obtained through patient and through husband who is at bedside.  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  She opted not to receive chemo or radiation.  She has not been follow-up with her doctor much lately.  For the past several month there has been a steady decline in her health, she is losing weight rapidly.  She has no appetite, endorsed nausea without vomiting and for the past week she has had recurrent dark tarry stool.  Today she had a syncopal episode while sitting on the toilet."  Patient states her husband brought her here because she is having multiple medical issues.  Reports fatigue and anorexia.  States since June last year her appetite has progressively declined.  Now it is to the point where she is not able to eat anything and can hardly drink 2 ounces of water at a time.  She feels a pressure-like/muscle tightening sensation in her entire torso.  She wants to eat but her body does not allow her to do so.  Since December she has had progressively worsening dyspnea.  Denies fevers, cough, chest pain, nausea, or vomiting.  States 6 years ago she had a mammogram and breast ultrasound done and was told she had a tumor in her breast.  She decided not to pursue any treatment at that time and did not follow-up with an oncologist.  ED Course: Afebrile.  Not tachycardic.  Blood pressure soft but not hypotensive.  Oxygen saturation 86% on room air, improved with 2 L supplemental oxygen.  Hemoglobin  7.2, no prior baseline in the chart.  Black tarry stool noted on rectal exam and FOBT positive.  Potassium 3.3.  Lipase and LFTs normal. Chest x-ray showing moderate right pleural effusion. CT angiogram abdomen pelvis showing no evidence of active GI bleed.  Showing extensive bony metastatic disease with numerous destructive lytic lesions.  Largest lesion with associated soft tissue mass at the lower sternal margin.  Large right pleural effusion with associated pleural nodularity, consistent with metastatic disease.  Spiculated 11 mm left lower lobe pulmonary nodule, either metastatic or primary malignancy.  Marked cachexia.  Trace ascites.  ED provider discussed the case with Dr. Foy Guadalajara from GI who recommended starting PPI BID and keeping the patient n.p.o. at midnight for scope tomorrow.  Patient received IV Protonix 40 mg.  1 unit PRBCs ordered.  Review of Systems:  All systems reviewed and apart from history of presenting illness, are negative.  History reviewed. No pertinent past medical history.  Past Surgical History:  Procedure Laterality Date  . BREAST SURGERY       reports that she has been smoking. She does not have any smokeless tobacco history on file. She reports that she does not drink alcohol or use drugs.  No Known Allergies  No family history on file.  Prior to Admission medications   Medication Sig Start Date End Date Taking? Authorizing Provider  clonazePAM (KLONOPIN) 0.5 MG tablet Take 1 tablet (0.5 mg total) by mouth 2 (  two) times daily as needed for anxiety. 09/06/12  Yes Hess, Robyn M, PA-C  nystatin cream (MYCOSTATIN) Apply to affected area 2 times daily Patient not taking: Reported on 10/15/2019 09/06/12   Carman Ching, PA-C    Physical Exam: Vitals:   10/15/19 2326 10/15/19 2326 10/15/19 2345 10/15/19 2346  BP: (!) 126/57 (!) 126/57 (!) 129/57   Pulse: 95 95 97   Resp: 18 16 20    Temp: 98 F (36.7 C) 98 F (36.7 C) 97.8 F (36.6 C)   TempSrc:  Oral  Oral   SpO2:  100% 97%   Weight:    31.8 kg  Height:    5\' 5"  (1.651 m)    Physical Exam  Constitutional: She is oriented to person, place, and time.  Cachectic Extremely lethargic  HENT:  Head: Normocephalic.  Very dry mucous membranes  Eyes: Right eye exhibits no discharge. Left eye exhibits no discharge.  Cardiovascular: Normal rate, regular rhythm and intact distal pulses.  Pulmonary/Chest: Effort normal and breath sounds normal. No respiratory distress. She has no wheezes. She has no rales.  Abdominal: Soft. Bowel sounds are normal. She exhibits no distension. There is no abdominal tenderness. There is no guarding.  Musculoskeletal:        General: No edema.     Cervical back: Neck supple.  Neurological: She is alert and oriented to person, place, and time.  Skin: Skin is warm and dry. She is not diaphoretic.     Labs on Admission: I have personally reviewed following labs and imaging studies  CBC: Recent Labs  Lab 10/15/19 1835  WBC 7.2  NEUTROABS 6.3  HGB 7.2*  HCT 25.2*  MCV 94.0  PLT [id number]*   Basic Metabolic Panel: Recent Labs  Lab 10/15/19 1835  NA 136  K 3.3*  CL 92*  CO2 28  GLUCOSE 125*  BUN 20  CREATININE 0.54  CALCIUM 7.9*   GFR: Estimated Creatinine Clearance: 33.8 mL/min (by C-G formula based on SCr of 0.54 mg/dL). Liver Function Tests: Recent Labs  Lab 10/15/19 1835  AST 31  ALT 13  ALKPHOS 92  BILITOT 0.6  PROT 6.0*  ALBUMIN 2.3*   Recent Labs  Lab 10/15/19 1835  LIPASE 34   No results for input(s): AMMONIA in the last 168 hours. Coagulation Profile: No results for input(s): INR, PROTIME in the last 168 hours. Cardiac Enzymes: No results for input(s): CKTOTAL, CKMB, CKMBINDEX, TROPONINI in the last 168 hours. BNP (last 3 results) No results for input(s): PROBNP in the last 8760 hours. HbA1C: No results for input(s): HGBA1C in the last 72 hours. CBG: No results for input(s): GLUCAP in the last 168 hours. Lipid  Profile: No results for input(s): CHOL, HDL, LDLCALC, TRIG, CHOLHDL, LDLDIRECT in the last 72 hours. Thyroid Function Tests: No results for input(s): TSH, T4TOTAL, FREET4, T3FREE, THYROIDAB in the last 72 hours. Anemia Panel: No results for input(s): VITAMINB12, FOLATE, FERRITIN, TIBC, IRON, RETICCTPCT in the last 72 hours. Urine analysis: No results found for: COLORURINE, APPEARANCEUR, [place], [place], GLUCOSEU, HGBUR, BILIRUBINUR, KETONESUR, PROTEINUR, UROBILINOGEN, NITRITE, LEUKOCYTESUR  Radiological Exams on Admission: DG Chest Portable 1 View  Result Date: 10/15/2019 CLINICAL DATA:  Lethargy, diarrhea, syncope EXAM: PORTABLE CHEST 1 VIEW COMPARISON:  None. FINDINGS: Moderate right pleural effusion. Left lung is clear. No pneumothorax. The heart is normal in size. IMPRESSION: Moderate right pleural effusion. Electronically Signed   By: Julian Hy M.D.   On: 10/15/2019 19:51   CT Angio Abd/Pel W  and/or Wo Contrast  Result Date: 10/15/2019 CLINICAL DATA:  Gastrointestinal bleeding, dark stool, tobacco abuse, history of cancer EXAM: CTA ABDOMEN AND PELVIS WITHOUT AND WITH CONTRAST TECHNIQUE: Multidetector CT imaging of the abdomen and pelvis was performed using the standard protocol during bolus administration of intravenous contrast. Multiplanar reconstructed images and MIPs were obtained and reviewed to evaluate the vascular anatomy. CONTRAST:  170mL OMNIPAQUE IOHEXOL 350 MG/ML SOLN COMPARISON:  None. FINDINGS: VASCULAR Aorta: Normal caliber aorta without aneurysm, dissection, vasculitis or significant stenosis. Celiac: Patent without evidence of aneurysm, dissection, vasculitis or significant stenosis. SMA: Patent without evidence of aneurysm, dissection, vasculitis or significant stenosis. Incidental replaced right hepatic artery off the SMA. Renals: Both renal arteries are patent without evidence of aneurysm, dissection, vasculitis, fibromuscular dysplasia or significant stenosis. IMA:  Patent without evidence of aneurysm, dissection, vasculitis or significant stenosis. Inflow: Patent without evidence of aneurysm, dissection, vasculitis or significant stenosis. Proximal Outflow: Bilateral common femoral and visualized portions of the superficial and profunda femoral arteries are patent without evidence of aneurysm, dissection, vasculitis or significant stenosis. Veins: No obvious venous abnormality within the limitations of this arterial phase study. Review of the MIP images confirms the above findings. NON-VASCULAR Lower chest: There is a large right pleural effusion with compressive atelectasis of the right middle and right lower lobes. Pleural nodularity is seen within the anterior right costophrenic angle. Emphysematous changes are seen at the left lung base. There is a spiculated nodule in the left lower lobe measuring 11 mm, compatible with primary or metastatic malignancy. Hepatobiliary: No focal liver abnormality is seen. Status post cholecystectomy. No biliary dilatation. Pancreas: Unremarkable. No pancreatic ductal dilatation or surrounding inflammatory changes. Spleen: Normal in size without focal abnormality. Adrenals/Urinary Tract: Adrenal glands are unremarkable. Kidneys are normal, without renal calculi, focal lesion, or hydronephrosis. Bladder is decompressed which limits evaluation. Stomach/Bowel: No bowel obstruction or ileus. I do not see any contrast accumulation within the bowel lumen to suggest active gastrointestinal bleeding. Lymphatic: No pathologic adenopathy. Reproductive: Uterus and bilateral adnexa are unremarkable. Other: The patient is markedly cachectic. Mild diffuse subcutaneous edema. There is trace ascites.  No free intraperitoneal gas. Musculoskeletal: There are extensive skeletal metastases throughout all visualized bony structures, with numerous lytic lesions identified. Large destructive lesion with associated soft tissue mass seen at the lower sternal margin,  soft tissue mass measuring 4.1 x 2.9 cm. IMPRESSION: VASCULAR 1. No evidence of active gastrointestinal bleeding. 2. Extensive atherosclerosis.  No aortic aneurysm or dissection. NON-VASCULAR 1. Extensive bony metastatic disease, with numerous destructive lytic lesions. Largest lesion with associated soft tissue mass at the lower sternal margin. 2. Large right pleural effusion, with associated pleural nodularity, consistent with metastatic disease. 3. Spiculated 11 mm left lower lobe pulmonary nodule, either metastatic or primary malignancy. 4. Marked cachexia. 5. Trace ascites. Electronically Signed   By: Randa Ngo M.D.   On: 10/15/2019 21:49    EKG: Pending at this time.  Assessment/Plan Principal Problem:   Upper GI bleed Active Problems:   Acute blood loss anemia   Acute respiratory failure with hypoxia (HCC)   Pleural effusion   Metastasis (HCC)   Suspected upper GI bleed: Currently not tachycardic or hypotensive.  Hemoglobin 7.2, no prior baseline in chart.  Black tarry stool noted on rectal exam and FOBT positive.  CT angiogram abdomen pelvis showing no evidence of active GI bleed.  GI has been consulted and planning on doing EGD in the morning.  Plan is to keep n.p.o. after midnight.  IV PPI twice daily, IV fluid hydration, and monitor hemodynamics.  1 unit PRBCs ordered in the ED.  Order posttransfusion H&H.  Goal hemoglobin >8.   Symptomatic acute blood loss anemia: Suspect secondary to acute upper GI bleed.  Hemoglobin 7.2.  2 units PRBCs ordered.  Check posttransfusion H&H.  Acute hypoxic respiratory failure secondary to large right pleural effusion: Suspect pleural effusion is malignant in nature given history of untreated metastatic breast cancer and CT findings today.  On the saturation 86% on room air, improved with 2 L supplemental oxygen.  Orders for IR thoracentesis and labs placed.  Syncope: Likely multifactorial due to severe anemia and possible orthostasis in the  setting of acute GI bleed.  Hypoxia could also be contributing.  Continue management as mentioned above.  Check orthostatics and continue cardiac monitoring.  Mild hypokalemia: Replete potassium.  Check magnesium level and replete if low.  Continue to monitor electrolytes.  Metastatic disease, failure to thrive: Patient with history of previously diagnosed breast cancer with mets to bones.  She is currently not receiving any chemo or radiation and does not have any oncologist.  CT showing extensive bony metastatic disease with numerous destructive lytic lesions.  Largest lesion with associated soft tissue mass at the lower sternal margin.  Large right pleural effusion with associated pleural nodularity, consistent with metastatic disease.  Spiculated 11 mm left lower lobe pulmonary nodule, either metastatic or primary malignancy.  Please consult oncology in a.m.  DVT prophylaxis: SCDs Code Status: Patient wishes to be DNR/DNI. Family Communication: No family available at this time. Disposition Plan: Anticipate discharge after treatment of acute GI bleed, symptomatic anemia, and large right pleural effusion. Consults called: GI Admission status: It is my clinical opinion that admission to INPATIENT is reasonable and necessary because of the expectation that this patient will require hospital care that crosses at least 2 midnights to treat this condition based on the medical complexity of the problems presented.  Given the aforementioned information, the predictability of an adverse outcome is felt to be significant.  The medical decision making on this patient was of high complexity and the patient is at high risk for clinical deterioration, therefore this is a level 3 visit.  Shela Leff MD [facility]  If 7PM-7AM, please contact night-coverage [email or web address]  10/15/2019, 11:59 PM

## 2019-10-16 ENCOUNTER — Inpatient Hospital Stay (HOSPITAL_COMMUNITY): Payer: Medicare HMO

## 2019-10-16 ENCOUNTER — Encounter (HOSPITAL_COMMUNITY): Payer: Self-pay | Admitting: Internal Medicine

## 2019-10-16 DIAGNOSIS — K921 Melena: Principal | ICD-10-CM

## 2019-10-16 DIAGNOSIS — D62 Acute posthemorrhagic anemia: Secondary | ICD-10-CM

## 2019-10-16 LAB — URINALYSIS, ROUTINE W REFLEX MICROSCOPIC
Bilirubin Urine: NEGATIVE
Glucose, UA: NEGATIVE mg/dL
Ketones, ur: 20 mg/dL — AB
Leukocytes,Ua: NEGATIVE
Nitrite: NEGATIVE
Protein, ur: 30 mg/dL — AB
Specific Gravity, Urine: <1.005 — ABNORMAL LOW (ref 1.005–1.030)
pH: 6 (ref 5.0–8.0)

## 2019-10-16 LAB — BASIC METABOLIC PANEL
Anion gap: 13 (ref 5–15)
BUN: 21 mg/dL (ref 8–23)
CO2: 31 mmol/L (ref 22–32)
Calcium: 8.1 mg/dL — ABNORMAL LOW (ref 8.9–10.3)
Chloride: 93 mmol/L — ABNORMAL LOW (ref 98–111)
Creatinine, Ser: 0.43 mg/dL — ABNORMAL LOW (ref 0.44–1.00)
GFR calc Af Amer: >60 mL/min (ref >60)
GFR calc non Af Amer: >60 mL/min (ref >60)
Glucose, Bld: 68 mg/dL — ABNORMAL LOW (ref 70–99)
Potassium: 3.2 mmol/L — ABNORMAL LOW (ref 3.5–5.1)
Sodium: 137 mmol/L (ref 135–145)

## 2019-10-16 LAB — MAGNESIUM: Magnesium: 1.9 mg/dL (ref 1.7–2.4)

## 2019-10-16 LAB — OCCULT BLOOD, POC DEVICE: Fecal Occult Bld: POSITIVE — AB

## 2019-10-16 LAB — ABO/RH: ABO/RH(D): O POS

## 2019-10-16 LAB — GLUCOSE, CAPILLARY
Glucose-Capillary: 60 mg/dL — ABNORMAL LOW (ref 70–99)
Glucose-Capillary: 126 mg/dL — ABNORMAL HIGH (ref 70–99)

## 2019-10-16 LAB — HEMOGLOBIN AND HEMATOCRIT, BLOOD
HCT: 28.7 % — ABNORMAL LOW (ref 36.0–46.0)
Hemoglobin: 9.1 g/dL — ABNORMAL LOW (ref 12.0–15.0)

## 2019-10-16 LAB — LACTATE DEHYDROGENASE: LDH: 137 U/L (ref 98–192)

## 2019-10-16 LAB — BRAIN NATRIURETIC PEPTIDE: B Natriuretic Peptide: 78.5 pg/mL (ref 0.0–100.0)

## 2019-10-16 LAB — SARS CORONAVIRUS 2 (TAT 6-24 HRS): SARS Coronavirus 2: NEGATIVE

## 2019-10-16 MED ORDER — BENEPROTEIN PO POWD
1.0000 | Freq: Three times a day (TID) | ORAL | Status: DC
  Filled 2019-10-16: qty 227

## 2019-10-16 MED ORDER — CLONAZEPAM 0.5 MG PO TABS
0.5000 mg | ORAL_TABLET | Freq: Two times a day (BID) | ORAL | Status: DC | PRN
  Administered 2019-10-16 – 2019-10-18 (×4): 0.5 mg via ORAL
  Filled 2019-10-16 (×4): qty 1

## 2019-10-16 MED ORDER — RESOURCE INSTANT PROTEIN PO PWD PACKET
6.0000 g | Freq: Three times a day (TID) | ORAL | Status: DC
  Filled 2019-10-16 (×7): qty 6

## 2019-10-16 MED ORDER — MORPHINE SULFATE (PF) 2 MG/ML IV SOLN
2.0000 mg | INTRAVENOUS | Status: DC | PRN
  Administered 2019-10-16 – 2019-10-18 (×4): 2 mg via INTRAVENOUS
  Filled 2019-10-16 (×5): qty 1

## 2019-10-16 MED ORDER — ADULT MULTIVITAMIN W/MINERALS CH
1.0000 | ORAL_TABLET | Freq: Every day | ORAL | Status: DC
  Administered 2019-10-17: 1 via ORAL
  Filled 2019-10-16: qty 1

## 2019-10-16 MED ORDER — ENSURE ENLIVE PO LIQD: 237.0000 mL | Freq: Two times a day (BID) | ORAL | Status: DC

## 2019-10-16 MED ORDER — DEXTROSE 50 % IV SOLN
12.5000 g | Freq: Once | INTRAVENOUS | Status: AC
  Administered 2019-10-16: 12.5 g via INTRAVENOUS
  Filled 2019-10-16: qty 50

## 2019-10-16 MED ORDER — ORAL CARE MOUTH RINSE
15.0000 mL | Freq: Two times a day (BID) | OROMUCOSAL | Status: DC
  Administered 2019-10-16 – 2019-10-17 (×4): 15 mL via OROMUCOSAL

## 2019-10-16 NOTE — Progress Notes (Signed)
Initial Nutrition Assessment  DOCUMENTATION CODES:   Severe malnutrition in context of chronic illness, Underweight  INTERVENTION:  - will order Ensure Enlive BID, each supplement provides 350 kcal and 20 grams of protein. - will order Magic Cup BID with meals, each supplement provides 290 kcal and 9 grams of protein. - will order 1 packet Beneprotein TID, each packet provides 25 kcal, 6 grams protein. - will order daily multivitamin with minerals.    NUTRITION DIAGNOSIS:   Severe Malnutrition related to chronic illness, catabolic illness, cancer and cancer related treatments as evidenced by severe fat depletion, severe muscle depletion  GOAL:   Patient will meet greater than or equal to 90% of their needs  MONITOR:   PO intake, Supplement acceptance, Labs, Weight trends  REASON FOR ASSESSMENT:   Malnutrition Screening Tool  ASSESSMENT:   69 y.o. female with a medical history of breast cancer who presented to the ED for evaluation of dark, tarry stool and syncope. Patient was diagnosed with breast cancer approximately a year ago and reportedly had mets to her spine. She opted not to receive chemo or radiation.  She has not been following up with her doctor much lately.  For the past several months there has been a steady decline in her health, she is losing weight rapidly. She has no appetite, endorsed nausea without vomiting and for the past week she has had recurrent dark tarry stool. On 3/28 she had a syncopal episode while sitting on the toilet.  Diet advanced from NPO to Regular, thin liquids today at 1130; no intakes since that time. Patient reports that since 12/2018 her appetite has continued to decline. She reports worsening fatigue with decline in appetite and intakes. Intakes have declined to the point of being unable to consume more than 2 oz of liquids and is having difficulty with any solid foods due abdominal discomfort (twisting/squeezing sensation) and persistent  nausea.   Per chart review, weight on 3/28 was 70 lb and weight today is 71 lb. No weight information available from PTA.   Patient was to have thoracentesis this AM but declined prior to the start of the procedure.   Per notes: - suspected upper GIB - symptomatic acute blood loss anemia - s/p syncope - metastatic disease with FTT - DNR/DNI code status   Labs reviewed; CBGs: 60 and 126 mg/dl, K: 3.2 mmol/l, Cl: 93 mmol/l, creatinine: 0.43 mg/dl, Ca: 8.1 mg/dl. Medications reviewed; 40 mg oral protonix BID, 30 mEq Klor-Con x1 dose 3/28.     NUTRITION - FOCUSED PHYSICAL EXAM:    Most Recent Value  Orbital Region  Moderate depletion  Upper Arm Region  Severe depletion  Thoracic and Lumbar Region  Severe depletion  Buccal Region  Severe depletion  Temple Region  Severe depletion  Clavicle Bone Region  Severe depletion  Clavicle and Acromion Bone Region  Severe depletion  Scapular Bone Region  Unable to assess  Dorsal Hand  Severe depletion  Patellar Region  Unable to assess  Anterior Thigh Region  Unable to assess  Posterior Calf Region  Unable to assess  Edema (RD Assessment)  None  Hair  Reviewed  Eyes  Reviewed  Mouth  Reviewed  Skin  Reviewed  Nails  Reviewed       Diet Order:   Diet Order            Diet regular Room service appropriate? Yes; Fluid consistency: Thin  Diet effective now  EDUCATION NEEDS:   No education needs have been identified at this time  Skin:  Skin Assessment: Reviewed RN Assessment  Last BM:  3/28  Height:   Ht Readings from Last 1 Encounters:  10/16/19 5\' 5"  (1.651 m)    Weight:   Wt Readings from Last 1 Encounters:  10/16/19 32.4 kg    Estimated Nutritional Needs:  Kcal:  1705-1990 kcal (30-35 kcal/kg IBW) Protein:  85-100 grams Fluid:  >/= 1.8 L/day     Jarome Matin, MS, RD, LDN, CNSC Inpatient Clinical Dietitian RD pager # available in AMION  After hours/weekend pager # available in  [facility]

## 2019-10-16 NOTE — Progress Notes (Signed)
Patient ID: Teresa Hull, female   DOB: 29-Oct-1950, 69 y.o.   MRN: [id number] Pt presented to [place] dept today for rt thoracentesis. Following discussion of procedure with pt she has decided to hold on thoracentesis for now. Please reorder exam if pt chooses to proceed.

## 2019-10-16 NOTE — Progress Notes (Signed)
Hypoglycemic event  RN noted on am bloodwork pt glucose 68, verified cbg was 60. Gave d50 28ml per hypoglycemic protocol and notified MD [place]. Reported to [place] who will recheck cbg

## 2019-10-16 NOTE — TOC Progression Note (Signed)
Transition of Care [facility]) - Progression Note    Patient Details  Name: Teresa Hull MRN: [id number] Date of Birth: 1950-12-08  Transition of Care [facility]) CM/SW Contact  Rehana Uncapher, Juliann Pulse, RN Phone Number: 10/16/2019, 4:06 PM  Clinical Narrative:attempted to reach spouse-Robert-vm full & unable to accept emssages-f/u on d/c plans-per MD likely hospice-will f/u in am.            Expected Discharge Plan and Services                                                 Social Determinants of Health (SDOH) Interventions    Readmission Risk Interventions No flowsheet data found.

## 2019-10-16 NOTE — Progress Notes (Signed)
[facility])  Referral received initially from husband on 3/25 for palliative care in the home.  Pt subsequently admitted to hospital prior to [facility] engaging.   Contacted Robert, provided support and answered questions.  He understands the trajectory of her illness and wants to make her comfortable.   [facility] provides around the clock care for their disabled adult daughter.  He is worried about the effects of her illness and impending death on his daughter, should she return home with hospice.    At this time, not clear if she can return home or if she would be eligible for [facility].  Will follow up with Herbie Baltimore after EGD and results discussed.  Venia Carbon RN, BSN, [facility] Liaison

## 2019-10-16 NOTE — Consult Note (Addendum)
Referring Provider:  [facility]         Primary Care Physician:  Default, Provider, MD Primary Gastroenterologist:   Althia Forts           We were asked to see this patient for:   GI bleed               ASSESSMENT /  PLAN     # Melena ( intermittent) and [place] anemia. --Rule out PUD, erosive disease, AVMs,  Suspect anemia may be multifactorial ( nutritional , chronic illness, GI bleed)  --FOBT +.  --Hgb 7.2, improved to 9.1 post 1 uPRBC.  --Patient has untreated metastatic breast cancer.  It sounds like Hospice may get involved soon. Though anemia likely multifactorial she is open to EGD for further evaluation.  # Hypokalemia, K+ 3.2 --K+ ordered yesterday but looks like she didn't take it.      [facility] Attending   I have taken an interval history, reviewed the chart and examined the patient. I agree with the Advanced Practitioner's note, impression and recommendations.   Unfortunate patient with metastatic breast cancer not on treatment with melena - apparently more than she is saying per separate conversation with husband who says he has seen a fair amount of what sounds like melena   I have reviewed toe role and potential benefits of EGD - include diagnostic and therapeutic potential.  Plan for this tomorrow. About 215 - will swithch to clear liquids midnight and then NPO after 0600 Check INR in AM, CBC  The risks and benefits as well as alternatives of endoscopic procedure(s) have been discussed and reviewed. All questions answered. The patient agrees to proceed.  Do think assessment of goals of care a good idea - palliative med consult and ? If she would be a hospice candidate.  She is resting comfortably in bed w/o dyspnea on 2 L O2 so I do think we can sedate her though I explained she is at higher risk of problems.  Gatha Mayer, MD, [facility] 10/16/2019 2:42 PM      HPI:    Chief Complaint: Having black stools a few times over  the last month  History comes from chart   Teresa Hull is a 69 y.o. female with PMH significant for tobacco abuse, metastatic breast cancer, opted not to receive any chemotherapy nor radiation. Over the last several months she has had a steady decline in health with diminished appetite / weight loss / worsening dyspnea. Presented to ED yesterday with lethargy / nausea / vomiting and dark, tarry stools. Baseline hgb unknown but 7.2 yesterday, post one uPRBC hgb now 9.1. Normal BUN. CT angio yesterday >> large right pleural effusion, extensive metastatic bone disease, spiculated LLL nodule ( primary vrs metastatic), trace ascites  Teresa Hull has been having intermittent black stools over the last month. She attributes this to eating grapes which cause loose black stools. No Bismuth use. Stools more often brown than black. No dark emesis. She endorses upper abdominal pain with any PO intakes. With PO intake she gets diffuse upper abdominal pain which eventually narrows to include the epigastrium then radiates upward into her chest causing difficulty breathing. No known history of PUD. No NSAID use   Past Medical History:  Diagnosis Date  . Cancer [facility])     Past Surgical History:  Procedure Laterality Date  . BREAST SURGERY      Prior to Admission medications   Medication Sig Start Date End  Date Taking? Authorizing Provider  clonazePAM (KLONOPIN) 0.5 MG tablet Take 1 tablet (0.5 mg total) by mouth 2 (two) times daily as needed for anxiety. 09/06/12  Yes Hess, Robyn M, PA-C  nystatin cream (MYCOSTATIN) Apply to affected area 2 times daily Patient not taking: Reported on 10/15/2019 09/06/12   Carman Ching, PA-C    Current Facility-Administered Medications  Medication Dose Route Frequency Provider Last Rate Last Admin  . 0.9 %  sodium chloride infusion  10 mL/hr Intravenous Once Shela Leff, MD      . acetaminophen (TYLENOL) tablet 650 mg  650 mg Oral Q6H PRN Shela Leff, MD       Or    . acetaminophen (TYLENOL) suppository 650 mg  650 mg Rectal Q6H PRN Shela Leff, MD      . clonazePAM Bobbye Charleston) tablet 0.5 mg  0.5 mg Oral BID PRN Lang Snow, NP   0.5 mg at 10/16/19 0310  . feeding supplement (ENSURE ENLIVE) (ENSURE ENLIVE) liquid 237 mL  237 mL Oral BID BM Dahal, Binaya, MD      . MEDLINE mouth rinse  15 mL Mouth Rinse BID Shela Leff, MD   15 mL at 10/16/19 0908  . morphine 2 MG/ML injection 2 mg  2 mg Intravenous Q4H PRN Lang Snow, NP   2 mg at 10/16/19 0326  . multivitamin with minerals tablet 1 tablet  1 tablet Oral Daily Dahal, Binaya, MD      . pantoprazole (PROTONIX) injection 40 mg  40 mg Intravenous Q12H Shela Leff, MD   40 mg at 10/16/19 0856  . potassium chloride (KLOR-CON) CR tablet 30 mEq  30 mEq Oral Once Shela Leff, MD   Stopped at 10/16/19 0034  . protein supplement (RESOURCE BENEPROTEIN) powder packet 6 g  6 g Oral TID WC Dahal, Marlowe Aschoff, MD        Allergies as of 10/15/2019  . (No Known Allergies)   FHx is not contributory  . Social History   Social History Narrative   Married   Smoker   No drugs, EtOH      Review of Systems: All systems reviewed and negative except where noted in HPI.  Physical Exam: Vital signs in last 24 hours: Temp:  [96.3 F (35.7 C)-98.3 F (36.8 C)] 97.7 F (36.5 C) (03/29 1226) Pulse Rate:  [78-99] 78 (03/29 1226) Resp:  [13-22] 16 (03/29 1226) BP: (104-145)/(36-73) 145/62 (03/29 1226) SpO2:  [86 %-100 %] 100 % (03/29 1226) Weight:  [31.8 kg-32.4 kg] 32.4 kg (03/29 0141)   General:   Alert, female in NAD - asthenic and mm wasting, cachectic Psych:  Pleasant, cooperative. Normal mood and affect. Eyes:  Pupils equal, sclera clear, no icterus.   Conjunctiva pink. Ears:  Normal auditory acuity. Nose:  No deformity, discharge,  or lesions. Neck:  Supple; no masses Lungs:  Clear throughout to auscultation.   No wheezes, crackles, or rhonchi.  Heart:   Regular rate and rhythm; no murmurs, no lower extremity edema Abdomen:  Soft, non-distended, nontender, BS active, no palp mass   Rectal:  Deferred. Melenic stool on ED exam Msk:  Symmetrical without gross deformities. .There is a large nodule on sternum Neurologic:  Alert and  oriented x4;  grossly normal neurologically. Skin:  Intact without significant lesions or rashes.   Intake/Output from previous day: 03/28 0701 - 03/29 0700 In: 945 [P.O.:30; I.V.:300; Blood:615] Out: -  Intake/Output this shift: Total I/O In: 0  Out: 300 [Urine:300]  Lab  Results: Recent Labs    10/15/19 1835 10/16/19 0444  WBC 7.2  --   HGB 7.2* 9.1*  HCT 25.2* 28.7*  PLT 505*  --    BMET Recent Labs    10/15/19 1835 10/16/19 0451  NA 136 137  K 3.3* 3.2*  CL 92* 93*  CO2 28 31  GLUCOSE 125* 68*  BUN 20 21  CREATININE 0.54 0.43*  CALCIUM 7.9* 8.1*   LFT Recent Labs    10/15/19 1835  PROT 6.0*  ALBUMIN 2.3*  AST 31  ALT 13  ALKPHOS 92  BILITOT 0.6     . CBC Latest Ref Rng & Units 10/16/2019 10/15/2019  WBC 4.0 - 10.5 K/uL - 7.2  Hemoglobin 12.0 - 15.0 g/dL 9.1(L) 7.2(L)  Hematocrit 36.0 - 46.0 % 28.7(L) 25.2(L)  Platelets 150 - 400 K/uL - 505(H)    . CMP Latest Ref Rng & Units 10/16/2019 10/15/2019  Glucose 70 - 99 mg/dL 68(L) 125(H)  BUN 8 - 23 mg/dL 21 20  Creatinine 0.44 - 1.00 mg/dL 0.43(L) 0.54  Sodium 135 - 145 mmol/L 137 136  Potassium 3.5 - 5.1 mmol/L 3.2(L) 3.3(L)  Chloride 98 - 111 mmol/L 93(L) 92(L)  CO2 22 - 32 mmol/L 31 28  Calcium 8.9 - 10.3 mg/dL 8.1(L) 7.9(L)  Total Protein 6.5 - 8.1 g/dL - 6.0(L)  Total Bilirubin 0.3 - 1.2 mg/dL - 0.6  Alkaline Phos 38 - 126 U/L - 92  AST 15 - 41 U/L - 31  ALT 0 - 44 U/L - 13   Studies/Results: [place] CHEST (PLEURAL EFFUSION)  Result Date: 10/16/2019 CLINICAL DATA:  69 year old female with right-sided pleural effusion EXAM: CHEST ULTRASOUND COMPARISON:  None. FINDINGS: Ultrasound interrogation of the right chest  demonstrates a large pleural effusion. The fluid is anechoic. IMPRESSION: Large right-sided pleural effusion. Electronically Signed   By: Jacqulynn Cadet M.D.   On: 10/16/2019 12:29   DG Chest Portable 1 View  Result Date: 10/15/2019 CLINICAL DATA:  Lethargy, diarrhea, syncope EXAM: PORTABLE CHEST 1 VIEW COMPARISON:  None. FINDINGS: Moderate right pleural effusion. Left lung is clear. No pneumothorax. The heart is normal in size. IMPRESSION: Moderate right pleural effusion. Electronically Signed   By: Julian Hy M.D.   On: 10/15/2019 19:51   CT Angio Abd/Pel W and/or Wo Contrast  Result Date: 10/15/2019 CLINICAL DATA:  Gastrointestinal bleeding, dark stool, tobacco abuse, history of cancer EXAM: CTA ABDOMEN AND PELVIS WITHOUT AND WITH CONTRAST TECHNIQUE: Multidetector CT imaging of the abdomen and pelvis was performed using the standard protocol during bolus administration of intravenous contrast. Multiplanar reconstructed images and MIPs were obtained and reviewed to evaluate the vascular anatomy. CONTRAST:  178mL OMNIPAQUE IOHEXOL 350 MG/ML SOLN COMPARISON:  None. FINDINGS: VASCULAR Aorta: Normal caliber aorta without aneurysm, dissection, vasculitis or significant stenosis. Celiac: Patent without evidence of aneurysm, dissection, vasculitis or significant stenosis. SMA: Patent without evidence of aneurysm, dissection, vasculitis or significant stenosis. Incidental replaced right hepatic artery off the SMA. Renals: Both renal arteries are patent without evidence of aneurysm, dissection, vasculitis, fibromuscular dysplasia or significant stenosis. IMA: Patent without evidence of aneurysm, dissection, vasculitis or significant stenosis. Inflow: Patent without evidence of aneurysm, dissection, vasculitis or significant stenosis. Proximal Outflow: Bilateral common femoral and visualized portions of the superficial and profunda femoral arteries are patent without evidence of aneurysm, dissection,  vasculitis or significant stenosis. Veins: No obvious venous abnormality within the limitations of this arterial phase study. Review of the MIP images confirms the above findings.  NON-VASCULAR Lower chest: There is a large right pleural effusion with compressive atelectasis of the right middle and right lower lobes. Pleural nodularity is seen within the anterior right costophrenic angle. Emphysematous changes are seen at the left lung base. There is a spiculated nodule in the left lower lobe measuring 11 mm, compatible with primary or metastatic malignancy. Hepatobiliary: No focal liver abnormality is seen. Status post cholecystectomy. No biliary dilatation. Pancreas: Unremarkable. No pancreatic ductal dilatation or surrounding inflammatory changes. Spleen: Normal in size without focal abnormality. Adrenals/Urinary Tract: Adrenal glands are unremarkable. Kidneys are normal, without renal calculi, focal lesion, or hydronephrosis. Bladder is decompressed which limits evaluation. Stomach/Bowel: No bowel obstruction or ileus. I do not see any contrast accumulation within the bowel lumen to suggest active gastrointestinal bleeding. Lymphatic: No pathologic adenopathy. Reproductive: Uterus and bilateral adnexa are unremarkable. Other: The patient is markedly cachectic. Mild diffuse subcutaneous edema. There is trace ascites.  No free intraperitoneal gas. Musculoskeletal: There are extensive skeletal metastases throughout all visualized bony structures, with numerous lytic lesions identified. Large destructive lesion with associated soft tissue mass seen at the lower sternal margin, soft tissue mass measuring 4.1 x 2.9 cm. IMPRESSION: VASCULAR 1. No evidence of active gastrointestinal bleeding. 2. Extensive atherosclerosis.  No aortic aneurysm or dissection. NON-VASCULAR 1. Extensive bony metastatic disease, with numerous destructive lytic lesions. Largest lesion with associated soft tissue mass at the lower sternal  margin. 2. Large right pleural effusion, with associated pleural nodularity, consistent with metastatic disease. 3. Spiculated 11 mm left lower lobe pulmonary nodule, either metastatic or primary malignancy. 4. Marked cachexia. 5. Trace ascites. Electronically Signed   By: Randa Ngo M.D.   On: 10/15/2019 21:49    Principal Problem:   Upper GI bleed Active Problems:   Acute blood loss anemia   Acute respiratory failure with hypoxia (HCC)   Pleural effusion   Metastasis ([place])    Tye Savoy, NP-C @  10/16/2019, 2:42 PM

## 2019-10-16 NOTE — Progress Notes (Signed)
PROGRESS NOTE  Teresa Hull  DOB: Jun 20, 1951  PCP: Default, Provider, MD [id number]  DOA: 10/15/2019  LOS: 1 day   Chief Complaint  Patient presents with  . Rectal Bleeding   Brief narrative: Teresa Hull is a 69 y.o. female with PMH of tobacco abuse, metastatic breast cancer, opted not to receive any chemotherapy nor radiation. Patient presented to the ED on 3/20 at with complaint of nausea, vomiting, dark tarry stool.  Over the last several months she has been having a steady decline in health with diminished appetite / weight loss / worsening dyspnea.  She is also having intermittent black stool for last 1 month.  She has upper abdominal pain with any oral intake.  In the ED, she was noted to have a low hemoglobin of 7.2. 1 unit PRBC was given.   CT angio showed large right pleural effusion, extensive metastatic bone disease, spiculated LLL nodule ( primary vrs metastatic), trace ascites. Patient was admitted under hospitalist service for further evaluation and management.  Subjective: Patient was seen and examined this morning. Cachectic Caucasian female.  Slow to respond. Unable to answer orientation questions.  She says she wants to be hydrated.  Not in pain Earlier she refused to get thoracentesis done  Assessment/Plan: GI bleeding Normocytic anemia -Intermittent melena for last 1 month.  Hemoglobin 7.2 at presentation -Wanted PRBC transfused.  Hemoglobin improved to 9.1 this morning. -GI consultation obtained.  Tentative plan for EGD tomorrow.  Metastatic breast cancer -Patient opted not to undergo any chemotherapy or radiation. -Patient may be appropriate for hospice at this time.  Need to discuss with patient and family. -Called patient husband but could not reach him.  Could not leave voicemail either.  Acute hypoxic respiratory failure Large right pleural effusion -Refused thoracentesis today -On 2 L oxygen by nasal cannula  Severe protein calorie  malnutrition Failure to thrive -Body mass index is 11.89 kg/m.  -Nutrition consult obtained.  Nutrition supplement ordered.  Mobility: Looks very weak and fragile.   Code Status:  DNR per chart  DVT prophylaxis:  SCDs Antimicrobials:  None Fluid: None Diet: Regular diet for now.  N.p.o. after midnight for procedure tomorrow  Family Communication:  Unable to reach husband on phone.  Will try later Admitted From: Home Anticipated date and disposition: Home, appropriate for home hospice if patient and family chooses Barriers: EGD tomorrow  Consultants:  GI  Antimicrobials: Anti-infectives (From admission, onward)   None        Code Status: DNR   Diet Order            Diet NPO time specified Except for: Sips with Meds  Diet effective ____        Diet clear liquid Room service appropriate? Yes; Fluid consistency: Thin  Diet effective midnight        Diet regular Room service appropriate? Yes; Fluid consistency: Thin  Diet effective now              Infusions:  . sodium chloride      Scheduled Meds: . feeding supplement (ENSURE ENLIVE)  237 mL Oral BID BM  . mouth rinse  15 mL Mouth Rinse BID  . multivitamin with minerals  1 tablet Oral Daily  . pantoprazole (PROTONIX) IV  40 mg Intravenous Q12H  . potassium chloride  30 mEq Oral Once  . protein supplement  6 g Oral TID WC    PRN meds: acetaminophen **OR** acetaminophen, clonazePAM, morphine injection   Objective:  Vitals:   10/16/19 0651 10/16/19 1226  BP:  (!) 145/62  Pulse:  78  Resp:  16  Temp:  97.7 F (36.5 C)  SpO2: 93% 100%    Intake/Output Summary (Last 24 hours) at 10/16/2019 1444 Last data filed at 10/16/2019 1130 Gross per 24 hour  Intake 945 ml  Output 300 ml  Net 645 ml   Filed Weights   10/15/19 2346 10/16/19 0141  Weight: 31.8 kg 32.4 kg   Weight change:  Body mass index is 11.89 kg/m.   Physical Exam: General exam: Elderly female, cachectic Skin: No rashes, lesions or  ulcers. HEENT: Atraumatic, normocephalic, supple neck, no obvious bleeding Lungs: Diminished air entry on the right Chest wall: Untreated breast cancer CVS: Regular rate and rhythm, no murmur GI/Abd soft, nontender, nondistended, bowel sound present CNS: Alert, awake, slow to respond, unable to answer orientation questions Psychiatry: Depressed look Extremities: No pedal edema, no calf tenderness  Data Review: I have personally reviewed the laboratory data and studies available.  Recent Labs  Lab 10/15/19 1835 10/16/19 0444  WBC 7.2  --   NEUTROABS 6.3  --   HGB 7.2* 9.1*  HCT 25.2* 28.7*  MCV 94.0  --   PLT 505*  --    Recent Labs  Lab 10/15/19 1835 10/15/19 2316 10/16/19 0451  NA 136  --  137  K 3.3*  --  3.2*  CL 92*  --  93*  CO2 28  --  31  GLUCOSE 125*  --  68*  BUN 20  --  21  CREATININE 0.54  --  0.43*  CALCIUM 7.9*  --  8.1*  MG  --  1.9  --     Signed, Terrilee Croak, MD [facility] Pager: [phone] (Secure Chat preferred). 10/16/2019

## 2019-10-17 ENCOUNTER — Encounter (HOSPITAL_COMMUNITY): Payer: Self-pay | Admitting: Registered Nurse

## 2019-10-17 ENCOUNTER — Encounter (HOSPITAL_COMMUNITY)
Admission: EM | Disposition: A | Discharge status: Hospice | Payer: Self-pay | Source: Home / Self Care | Attending: Internal Medicine

## 2019-10-17 LAB — CBC
HCT: 28.2 % — ABNORMAL LOW (ref 36.0–46.0)
Hemoglobin: 8.9 g/dL — ABNORMAL LOW (ref 12.0–15.0)
MCH: 28.4 pg (ref 26.0–34.0)
MCHC: 31.6 g/dL (ref 30.0–36.0)
MCV: 90.1 fL (ref 80.0–100.0)
Platelets: 427 10*3/uL — ABNORMAL HIGH (ref 150–400)
RBC: 3.13 MIL/uL — ABNORMAL LOW (ref 3.87–5.11)
RDW: 17.6 % — ABNORMAL HIGH (ref 11.5–15.5)
WBC: 8.5 10*3/uL (ref 4.0–10.5)
nRBC: 0 % (ref 0.0–0.2)

## 2019-10-17 LAB — PROTIME-INR
INR: 1.1 (ref 0.8–1.2)
Prothrombin Time: 14.2 seconds (ref 11.4–15.2)

## 2019-10-17 LAB — HIV ANTIBODY (ROUTINE TESTING W REFLEX): HIV Screen 4th Generation wRfx: NONREACTIVE

## 2019-10-17 SURGERY — ESOPHAGOGASTRODUODENOSCOPY (EGD) WITH PROPOFOL
Anesthesia: Monitor Anesthesia Care

## 2019-10-17 NOTE — Progress Notes (Addendum)
PT has taken of telemetry leads and says she does not want them on on call made aware as well as central Telemetry. Pt is reported to have refused lab draws this morning.

## 2019-10-17 NOTE — Progress Notes (Signed)
Manufacturing engineer Documentation  Liaison received referral for pt to dc home with hospice care.   Writer called pt's husband who stated that pt has not been able to eat for the past few weeks and that she is refusing any further testing, care and food. Stated that he prefers her to dc to [facility] if eligible.   Pt's chart reviewed and pt deemed eligible for [facility] upon discharge. [place] will meet to do paperwork with husband tomorrow.   Please do not hesitate to outreach with any questions and thank you for the referral.   Freddie Breech, RN [facility] Liaison [phone]

## 2019-10-17 NOTE — Progress Notes (Signed)
   Patient has decided not to have EGD.  She will be transitioning to hospice.  discussed w/ her and Dr. Pietro Cassis.  Signing off.  Gatha Mayer, MD, [facility] 10/17/2019 3:27 PM

## 2019-10-17 NOTE — TOC Progression Note (Signed)
Transition of Care [facility]) - Progression Note    Patient Details  Name: Teresa Hull MRN: [id number] Date of Birth: 09-01-1950  [facility] CM/SW Contact  Matan Steen, Juliann Pulse, RN Phone Number: 10/17/2019, 9:11 AM  Clinical Narrative: Spoke to spouse-Robert yesterday on phone. He would like recc for hospice services. He has PTA had conversations w/[facility]. Please confirm prognosis for qualification for either home w/hospice vs residential hospice.          Expected Discharge Plan and Services                                                 Social Determinants of Health (SDOH) Interventions    Readmission Risk Interventions No flowsheet data found.

## 2019-10-17 NOTE — Plan of Care (Signed)

## 2019-10-17 NOTE — Progress Notes (Signed)
Assumed care of patient from off going RN, agree with previous assessment

## 2019-10-17 NOTE — Progress Notes (Signed)
PROGRESS NOTE  Teresa Hull  DOB: Mar 20, 1951  PCP: Default, Provider, MD [id number]  DOA: 10/15/2019  LOS: 2 days   Chief Complaint  Patient presents with  . Rectal Bleeding   Brief narrative: Teresa Hull is a 69 y.o. female with PMH of tobacco abuse, metastatic breast cancer, opted not to receive any chemotherapy nor radiation. Patient presented to the ED on 3/20 at with complaint of nausea, vomiting, dark tarry stool.  Over the last several months she has been having a steady decline in health with diminished appetite / weight loss / worsening dyspnea.  She is also having intermittent black stool for last 1 month.  She has upper abdominal pain with any oral intake.  In the ED, she was noted to have a low hemoglobin of 7.2. 1 unit PRBC was given.   CT angio showed large right pleural effusion, extensive metastatic bone disease, spiculated LLL nodule ( primary vrs metastatic), trace ascites. Patient was admitted under hospitalist service for further evaluation and management.  Subjective: Patient was seen and examined this morning. Cachectic Caucasian female. Alert, awake, able to have a conversation but she has episodes of memory lapse and inconsistencies.   She refused EGD this morning.  She is asking questions about the difference between home hospice and hospice facility.  Assessment/Plan: GI bleeding Normocytic anemia -Intermittent melena for last 1 month.  Hemoglobin 7.2 at presentation -Patient received 1 unit PRBC transfused.  Hemoglobin improved to 9.1 this morning. -GI consultation obtained.  Recommended EGD.  Patient initially agreed.  Refused to get it done this morning.  Metastatic breast cancer Hospice appropriate -Patient opted not to undergo any chemotherapy or radiation. -Patient may be appropriate for hospice at this time.  Need to discuss with patient and family. -I called and updated patient's husband yesterday afternoon.  He agrees with the idea of  hospice. Patient this morning also agrees with it. Patient and her husband are not sure about home hospice versus hospice facility.  -In my opinion, patient has advanced metastatic cancer and she is hospice appropriate but she is not acutely sick at the risk of imminent death. She probably has few weeks to months to live.  Acute hypoxic respiratory failure Large right pleural effusion -Refused thoracentesis -On 2 L oxygen by nasal cannula  Severe protein calorie malnutrition Failure to thrive -Body mass index is 11.89 kg/m.  -Nutrition consult obtained.  Nutrition supplement ordered.  Mobility: Looks very weak and fragile.   Code Status:  DNR  DVT prophylaxis:  SCDs Antimicrobials:  None Fluid: None Diet: Regular diet for now.  N.p.o. after midnight for procedure tomorrow  Family Communication:  Unable to reach husband on phone.  Will try later Admitted From: Home Anticipated date and disposition: Home, appropriate for home hospice or facility.   Barriers: Pending hospice evaluation. Patient also seems to be making inconsistent choices at times.  This morning, she lost her IV access.  I told her that she could keep up with oral hydration.  However patient stated that she feels dehydrated and would like to get 24 hours of IV hydration.  Clinically she looks dehydrated and I will allow her that.  Consultants:  GI  Antimicrobials: Anti-infectives (From admission, onward)   None        Code Status: DNR   Diet Order            Diet clear liquid Room service appropriate? Yes; Fluid consistency: Thin  Diet effective midnight  Infusions:    Scheduled Meds: . feeding supplement (ENSURE ENLIVE)  237 mL Oral BID BM  . mouth rinse  15 mL Mouth Rinse BID  . multivitamin with minerals  1 tablet Oral Daily  . pantoprazole (PROTONIX) IV  40 mg Intravenous Q12H  . potassium chloride  30 mEq Oral Once  . protein supplement  6 g Oral TID WC    PRN  meds: acetaminophen **OR** acetaminophen, clonazePAM, morphine injection   Objective: Vitals:   10/16/19 2007 10/17/19 0900  BP: 128/73 122/61  Pulse: 79 90  Resp: 20 18  Temp: 97.9 F (36.6 C) 98.7 F (37.1 C)  SpO2: 100%     Intake/Output Summary (Last 24 hours) at 10/17/2019 1316 Last data filed at 10/16/2019 2200 Gross per 24 hour  Intake 480.93 ml  Output --  Net 480.93 ml   Filed Weights   10/15/19 2346 10/16/19 0141  Weight: 31.8 kg 32.4 kg   Weight change:  Body mass index is 11.89 kg/m.   Physical Exam: General exam: Elderly female, cachectic, not in physical distress Skin: Looks dry.  No active ulcers HEENT: Atraumatic, normocephalic, supple neck, no obvious bleeding Lungs: Diminished air entry on the right Chest wall: Untreated breast cancer CVS: Regular rate and rhythm, no murmur GI/Abd soft, nontender, nondistended, bowel sound present CNS: Alert, awake, slow to respond, unable to answer orientation questions Psychiatry: Depressed look Extremities: No pedal edema, no calf tenderness  Data Review: I have personally reviewed the laboratory data and studies available.  Recent Labs  Lab 10/15/19 1835 10/16/19 0444 10/17/19 0945  WBC 7.2  --  8.5  NEUTROABS 6.3  --   --   HGB 7.2* 9.1* 8.9*  HCT 25.2* 28.7* 28.2*  MCV 94.0  --  90.1  PLT 505*  --  427*   Recent Labs  Lab 10/15/19 1835 10/15/19 2316 10/16/19 0451  NA 136  --  137  K 3.3*  --  3.2*  CL 92*  --  93*  CO2 28  --  31  GLUCOSE 125*  --  68*  BUN 20  --  21  CREATININE 0.54  --  0.43*  CALCIUM 7.9*  --  8.1*  MG  --  1.9  --     Signed, Terrilee Croak, MD [facility] Pager: [phone] (Secure Chat preferred). 10/17/2019

## 2019-10-17 NOTE — TOC Progression Note (Signed)
Transition of Care [facility]) - Progression Note    Patient Details  Name: Teresa Hull MRN: [id number] Date of Birth: Apr 13, 1951  [facility] Care [facility]) CM/SW Contact  Consuello Lassalle, Juliann Pulse, RN Phone Number: 10/17/2019, 3:57 PM  Clinical Narrative: Noted patient approved for [facility]-spouse in agreement-MD notified for CM referral to offer choice for residential hospice.      Expected Discharge Plan: [place] Barriers to Discharge: Continued Medical Work up  Expected Discharge Plan and Services Expected Discharge Plan: [place]                                               Social Determinants of Health (SDOH) Interventions    Readmission Risk Interventions No flowsheet data found.

## 2019-10-17 NOTE — Progress Notes (Signed)
After report I noticed patient had pitcher of water, gingerale on bedside table. I was told she had been NPO since 11pm for possible EGD today. When spoke to patient during assessment she said she didn't want anything more done, and didn't want procedure. She wanted to just go home.  I text hospitalist and made aware of her wishes not to do EGD and asked to discontinue telemetry as she had refused it back on in the last shift.  Radiology called, made them aware and they said they would follow up with GI about this.

## 2019-10-18 DIAGNOSIS — Z66 Do not resuscitate: Secondary | ICD-10-CM

## 2019-10-18 DIAGNOSIS — Z515 Encounter for palliative care: Secondary | ICD-10-CM

## 2019-10-18 DIAGNOSIS — E43 Unspecified severe protein-calorie malnutrition: Secondary | ICD-10-CM | POA: Insufficient documentation

## 2019-10-18 NOTE — Progress Notes (Signed)
Report called to [facility] (given to [place]), will transport with PIV to Left forearm, personal belongings with patient, PTAR to transport

## 2019-10-18 NOTE — TOC Transition Note (Signed)
Transition of Care [facility]) - CM/SW Discharge Note   Patient Details  Name: KATIRIA ORIOL MRN: [id number] Date of Birth: [birth year]  Transition of Care [facility]) CM/SW Contact:  Dessa Phi, RN Phone Number: 10/18/2019, 10:29 AM   Clinical Narrative: Damaris Schooner to Herbie [place] spouse-in agreement to d/c to [facility] today-he will complete paper work, & is aware patient will d/c to [facility] today by [facility] signed. Await d/c summary,& [place] for bed, & nurse to call report prior PTAR to be called.     Final next level of care: [place] Barriers to Discharge: No Barriers Identified   Patient Goals and CMS Choice        Discharge Placement              Patient chooses bed at: [facility]) Patient to be transferred to facility by: [place] Name of family member notified: Tory Emerald spouse Patient and family notified of of transfer: 10/18/19  Discharge Plan and Services                                     Social Determinants of Health (SDOH) Interventions     Readmission Risk Interventions No flowsheet data found.

## 2019-10-18 NOTE — Discharge Summary (Signed)
Physician Discharge Summary  Teresa Hull [id number] DOB: 1950/10/07 DOA: 10/15/2019  PCP: Default, Provider, MD  Admit date: 10/15/2019 Discharge date: 10/18/2019  Admitted From: Home Disposition: Hospice house   Discharge Condition: Hospice CODE STATUS: DNR Diet recommendation: As tolerated; for comfort  Brief/Interim Summary:  Admission HPI written by Shela Leff, MD   HPI: Teresa Hull is a 69 y.o. female with a past medical history of breast cancer presenting to the ED for evaluation of dark tarry stool and syncope.  No family available at this time. Per ED provider's conversation with the patient's husband: "History obtained through patient and through husband who is at bedside. 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 She opted not to receive chemo or radiation. She has not been follow-up with her doctor much lately. For the past several month there has been a steady decline in her health, she is losing weight rapidly. She has no appetite, endorsed nausea without vomiting and for the past week she has had recurrent dark tarry stool. Today she had a syncopal episode while sitting on the toilet."  Patient states her husband brought her here because she is having multiple medical issues.  Reports fatigue and anorexia.  States since June last year her appetite has progressively declined.  Now it is to the point where she is not able to eat anything and can hardly drink 2 ounces of water at a time.  She feels a pressure-like/muscle tightening sensation in her entire torso.  She wants to eat but her body does not allow her to do so.  Since December she has had progressively worsening dyspnea.  Denies fevers, cough, chest pain, nausea, or vomiting.  States 6 years ago she had a mammogram and breast ultrasound done and was told she had a tumor in her breast.  She decided not to pursue any treatment at that time  and did not follow-up with an oncologist.  ED Course: Afebrile.  Not tachycardic.  Blood pressure soft but not hypotensive.  Oxygen saturation 86% on room air, improved with 2 L supplemental oxygen.  Hemoglobin 7.2, no prior baseline in the chart.  Black tarry stool noted on rectal exam and FOBT positive.  Potassium 3.3.  Lipase and LFTs normal. Chest x-ray showing moderate right pleural effusion. CT angiogram abdomen pelvis showing no evidence of active GI bleed.  Showing extensive bony metastatic disease with numerous destructive lytic lesions.  Largest lesion with associated soft tissue mass at the lower sternal margin.  Large right pleural effusion with associated pleural nodularity, consistent with metastatic disease.  Spiculated 11 mm left lower lobe pulmonary nodule, either metastatic or primary malignancy.  Marked cachexia.  Trace ascites.   Hospital course:  GI bleeding Normocytic anemia Intermittent melena for last 1 month.  Hemoglobin 7.2 at presentation. Patient received 1 unit PRBC transfused.  Hemoglobin improved to 9.1. GI consulted and recommended EGD which was declined by patient. Patient transitioned to comfort measures.  Metastatic breast cancer Hospice appropriate Patient opted not to undergo any chemotherapy or radiation. Comfort care.  Acute hypoxic respiratory failure Large right pleural effusion Refused thoracentesis. Maintain oxygen for comfort.  Severe protein calorie malnutrition Failure to thrive Body mass index is 11.89 kg/m.   Discharge Diagnoses:  Principal Problem:   Upper GI bleed Active Problems:   Acute blood loss anemia   Acute respiratory failure with hypoxia (HCC)   Pleural effusion  Metastasis ([place])   Protein-calorie malnutrition, severe   Comfort measures only status   DNR (do not resuscitate)    Discharge Instructions   Allergies as of 10/18/2019   No Known Allergies     Medication List    STOP taking these medications     nystatin cream Commonly known as: MYCOSTATIN     TAKE these medications   clonazePAM 0.5 MG tablet Commonly known as: KLONOPIN Take 1 tablet (0.5 mg total) by mouth 2 (two) times daily as needed for anxiety.       No Known Allergies  Consultations:  Gastroenterology   Procedures/Studies: [place] CHEST (PLEURAL EFFUSION)  Result Date: 10/16/2019 CLINICAL DATA:  69 year old female with right-sided pleural effusion EXAM: CHEST ULTRASOUND COMPARISON:  None. FINDINGS: Ultrasound interrogation of the right chest demonstrates a large pleural effusion. The fluid is anechoic. IMPRESSION: Large right-sided pleural effusion. Electronically Signed   By: Jacqulynn Cadet M.D.   On: 10/16/2019 12:29   DG Chest Portable 1 View  Result Date: 10/15/2019 CLINICAL DATA:  Lethargy, diarrhea, syncope EXAM: PORTABLE CHEST 1 VIEW COMPARISON:  None. FINDINGS: Moderate right pleural effusion. Left lung is clear. No pneumothorax. The heart is normal in size. IMPRESSION: Moderate right pleural effusion. Electronically Signed   By: Julian Hy M.D.   On: 10/15/2019 19:51   CT Angio Abd/Pel W and/or Wo Contrast  Result Date: 10/15/2019 CLINICAL DATA:  Gastrointestinal bleeding, dark stool, tobacco abuse, history of cancer EXAM: CTA ABDOMEN AND PELVIS WITHOUT AND WITH CONTRAST TECHNIQUE: Multidetector CT imaging of the abdomen and pelvis was performed using the standard protocol during bolus administration of intravenous contrast. Multiplanar reconstructed images and MIPs were obtained and reviewed to evaluate the vascular anatomy. CONTRAST:  192mL OMNIPAQUE IOHEXOL 350 MG/ML SOLN COMPARISON:  None. FINDINGS: VASCULAR Aorta: Normal caliber aorta without aneurysm, dissection, vasculitis or significant stenosis. Celiac: Patent without evidence of aneurysm, dissection, vasculitis or significant stenosis. SMA: Patent without evidence of aneurysm, dissection, vasculitis or significant stenosis. Incidental replaced  right hepatic artery off the SMA. Renals: Both renal arteries are patent without evidence of aneurysm, dissection, vasculitis, fibromuscular dysplasia or significant stenosis. IMA: Patent without evidence of aneurysm, dissection, vasculitis or significant stenosis. Inflow: Patent without evidence of aneurysm, dissection, vasculitis or significant stenosis. Proximal Outflow: Bilateral common femoral and visualized portions of the superficial and profunda femoral arteries are patent without evidence of aneurysm, dissection, vasculitis or significant stenosis. Veins: No obvious venous abnormality within the limitations of this arterial phase study. Review of the MIP images confirms the above findings. NON-VASCULAR Lower chest: There is a large right pleural effusion with compressive atelectasis of the right middle and right lower lobes. Pleural nodularity is seen within the anterior right costophrenic angle. Emphysematous changes are seen at the left lung base. There is a spiculated nodule in the left lower lobe measuring 11 mm, compatible with primary or metastatic malignancy. Hepatobiliary: No focal liver abnormality is seen. Status post cholecystectomy. No biliary dilatation. Pancreas: Unremarkable. No pancreatic ductal dilatation or surrounding inflammatory changes. Spleen: Normal in size without focal abnormality. Adrenals/Urinary Tract: Adrenal glands are unremarkable. Kidneys are normal, without renal calculi, focal lesion, or hydronephrosis. Bladder is decompressed which limits evaluation. Stomach/Bowel: No bowel obstruction or ileus. I do not see any contrast accumulation within the bowel lumen to suggest active gastrointestinal bleeding. Lymphatic: No pathologic adenopathy. Reproductive: Uterus and bilateral adnexa are unremarkable. Other: The patient is markedly cachectic. Mild diffuse subcutaneous edema. There is trace ascites.  No free intraperitoneal gas.  Musculoskeletal: There are extensive skeletal  metastases throughout all visualized bony structures, with numerous lytic lesions identified. Large destructive lesion with associated soft tissue mass seen at the lower sternal margin, soft tissue mass measuring 4.1 x 2.9 cm. IMPRESSION: VASCULAR 1. No evidence of active gastrointestinal bleeding. 2. Extensive atherosclerosis.  No aortic aneurysm or dissection. NON-VASCULAR 1. Extensive bony metastatic disease, with numerous destructive lytic lesions. Largest lesion with associated soft tissue mass at the lower sternal margin. 2. Large right pleural effusion, with associated pleural nodularity, consistent with metastatic disease. 3. Spiculated 11 mm left lower lobe pulmonary nodule, either metastatic or primary malignancy. 4. Marked cachexia. 5. Trace ascites. Electronically Signed   By: Randa Ngo M.D.   On: 10/15/2019 21:49      Subjective: Urinating a lot. No other concerns  Discharge Exam: Vitals:   10/17/19 2027 10/18/19 0526  BP: (!) 148/90 (!) 145/85  Pulse: (!) 101 93  Resp: 20 18  Temp: 98.4 F (36.9 C) 98.3 F (36.8 C)  SpO2: 95% 99%   Vitals:   10/17/19 0900 10/17/19 1343 10/17/19 2027 10/18/19 0526  BP: 122/61 128/73 (!) 148/90 (!) 145/85  Pulse: 90 89 (!) 101 93  Resp: 18 18 20 18   Temp: 98.7 F (37.1 C) 98.3 F (36.8 C) 98.4 F (36.9 C) 98.3 F (36.8 C)  TempSrc: Oral Oral  Oral  SpO2: 96% 97% 95% 99%  Weight:      Height:       General exam: Appears calm and comfortable. Cachectic appearing. Respiratory system: Diminished. Respiratory effort normal. Cardiovascular system: S1 & S2 heard, RRR. No murmurs, rubs, gallops or clicks. Gastrointestinal system: Abdomen is nondistended, soft and nontender. No organomegaly or masses felt. Normal bowel sounds heard. Central nervous system: Alert and oriented. No focal neurological deficits. Extremities: No edema. No calf tenderness Skin: No cyanosis. No rashes Psychiatry: Judgement and insight appear normal. Mood &  affect appropriate.    The results of significant diagnostics from this hospitalization (including imaging, microbiology, ancillary and laboratory) are listed below for reference.     Microbiology: Recent Results (from the past 240 hour(s))  SARS CORONAVIRUS 2 (TAT 6-24 HRS) Nasopharyngeal Nasopharyngeal Swab     Status: None   Collection Time: 10/16/19 12:44 AM   Specimen: Nasopharyngeal Swab  Result Value Ref Range Status   SARS Coronavirus 2 NEGATIVE NEGATIVE Final    Comment: (NOTE) SARS-CoV-2 target nucleic acids are NOT DETECTED. The SARS-CoV-2 RNA is generally detectable in upper and lower respiratory specimens during the acute phase of infection. Negative results do not preclude SARS-CoV-2 infection, do not rule out co-infections with other pathogens, and should not be used as the sole basis for treatment or other patient management decisions. Negative results must be combined with clinical observations, patient history, and epidemiological information. The expected result is Negative. Fact Sheet for Patients: SugarRoll.be Fact Sheet for Healthcare Providers: https://www.woods-mathews.com/ This test is not yet approved or cleared by the [place] FDA and  has been authorized for detection and/or diagnosis of SARS-CoV-2 by FDA under an Emergency Use Authorization (EUA). This EUA will remain  in effect (meaning this test can be used) for the duration of the COVID-19 declaration under Section 56 4(b)(1) of the Act, 21 U.S.C. section 360bbb-3(b)(1), unless the authorization is terminated or revoked sooner. Performed at [facility], [place] [address], [place], [place] [address]      Labs: BNP (last 3 results) Recent Labs    10/15/19 2320  BNP [id number]   Basic Metabolic Panel: Recent Labs  Lab 10/15/19 1835 10/15/19 2316 10/16/19 0451  NA 136  --  137  K 3.3*  --  3.2*  CL 92*  --  93*  CO2 28  --  31  GLUCOSE 125*   --  68*  BUN 20  --  21  CREATININE 0.54  --  0.43*  CALCIUM 7.9*  --  8.1*  MG  --  1.9  --    Liver Function Tests: Recent Labs  Lab 10/15/19 1835  AST 31  ALT 13  ALKPHOS 92  BILITOT 0.6  PROT 6.0*  ALBUMIN 2.3*   Recent Labs  Lab 10/15/19 1835  LIPASE 34   No results for input(s): AMMONIA in the last 168 hours. CBC: Recent Labs  Lab 10/15/19 1835 10/16/19 0444 10/17/19 0945  WBC 7.2  --  8.5  NEUTROABS 6.3  --   --   HGB 7.2* 9.1* 8.9*  HCT 25.2* 28.7* 28.2*  MCV 94.0  --  90.1  PLT 505*  --  427*   Cardiac Enzymes: No results for input(s): CKTOTAL, CKMB, CKMBINDEX, TROPONINI in the last 168 hours. BNP: Invalid input(s): POCBNP CBG: Recent Labs  Lab 10/16/19 0713 10/16/19 0803  GLUCAP 60* 126*   D-Dimer No results for input(s): DDIMER in the last 72 hours. Hgb A1c No results for input(s): HGBA1C in the last 72 hours. Lipid Profile No results for input(s): CHOL, HDL, LDLCALC, TRIG, CHOLHDL, LDLDIRECT in the last 72 hours. Thyroid function studies No results for input(s): TSH, T4TOTAL, T3FREE, THYROIDAB in the last 72 hours.  Invalid input(s): FREET3 Anemia work up No results for input(s): VITAMINB12, FOLATE, FERRITIN, TIBC, IRON, RETICCTPCT in the last 72 hours. Urinalysis    Component Value Date/Time   COLORURINE YELLOW 10/16/2019 0730   APPEARANCEUR CLEAR 10/16/2019 0730   LABSPEC <1.005 (L) 10/16/2019 0730   PHURINE 6.0 10/16/2019 0730   GLUCOSEU NEGATIVE 10/16/2019 0730   HGBUR SMALL (A) 10/16/2019 0730   BILIRUBINUR NEGATIVE 10/16/2019 0730   KETONESUR 20 (A) 10/16/2019 0730   PROTEINUR 30 (A) 10/16/2019 0730   NITRITE NEGATIVE 10/16/2019 0730   LEUKOCYTESUR NEGATIVE 10/16/2019 0730   Sepsis Labs Invalid input(s): PROCALCITONIN,  WBC,  LACTICIDVEN Microbiology Recent Results (from the past 240 hour(s))  SARS CORONAVIRUS 2 (TAT 6-24 HRS) Nasopharyngeal Nasopharyngeal Swab     Status: None   Collection Time: 10/16/19 12:44 AM    Specimen: Nasopharyngeal Swab  Result Value Ref Range Status   SARS Coronavirus 2 NEGATIVE NEGATIVE Final    Comment: (NOTE) SARS-CoV-2 target nucleic acids are NOT DETECTED. The SARS-CoV-2 RNA is generally detectable in upper and lower respiratory specimens during the acute phase of infection. Negative results do not preclude SARS-CoV-2 infection, do not rule out co-infections with other pathogens, and should not be used as the sole basis for treatment or other patient management decisions. Negative results must be combined with clinical observations, patient history, and epidemiological information. The expected result is Negative. Fact Sheet for Patients: SugarRoll.be Fact Sheet for Healthcare Providers: https://www.woods-mathews.com/ This test is not yet approved or cleared by the [place] FDA and  has been authorized for detection and/or diagnosis of SARS-CoV-2 by FDA under an Emergency Use Authorization (EUA). This EUA will remain  in effect (meaning this test can be used) for the duration of the COVID-19 declaration under Section 56 4(b)(1) of the Act, 21 U.S.C. section 360bbb-3(b)(1), unless the authorization is terminated or revoked sooner.  Performed at [facility], [place] [address], [place], [place] [address]      Time coordinating discharge: 35 minutes  SIGNED:   Cordelia Poche, MD [facility] 10/18/2019, 11:09 AM

## 2019-10-18 NOTE — Progress Notes (Signed)
Engineer, maintenance [facility]) Hospital Liaison note.     Registration paperwork completed. Patient can transfer to [facility], please arrange transport.    RN please call report to [phone].   Thank you,     Farrel Gordon, RN, CCM       [place] (listed on [place] under [facility])     [phone]

## 2019-10-19 LAB — TYPE AND SCREEN
ABO/RH(D): O POS
Antibody Screen: NEGATIVE
Unit division: 0
Unit division: 0

## 2019-10-19 LAB — BPAM RBC
Blood Product Expiration Date: 202104282359
Blood Product Expiration Date: 202104282359
ISSUE DATE / TIME: 202103282307
Unit Type and Rh: 5100
Unit Type and Rh: 5100

## 2019-11-18 DEATH — deceased

## 2020-05-24 IMAGING — CT CT CTA ABD/PEL W/CM AND/OR W/O CM
4 of 12 series · 11 of 46 positions shown, 16 images · IV contrast (omnipaque)
Comparison: None.

CLINICAL DATA: Gastrointestinal bleeding, dark stool, tobacco
abuse, history of cancer

EXAM:
CTA ABDOMEN AND PELVIS WITHOUT AND WITH CONTRAST
TECHNIQUE: Multidetector CT imaging of the abdomen and pelvis was performed
using the standard protocol during bolus administration of
intravenous contrast. Multiplanar reconstructed images and MIPs were
obtained and reviewed to evaluate the vascular anatomy.
CONTRAST:  100mL OMNIPAQUE IOHEXOL 350 MG/ML SOLN

[Series 2: pre · axial · non-contrast · 0.62mm/px · z∈[-408,-263]mm · 2 of 88 slices shown]
[im 30/88  soft-tissue]
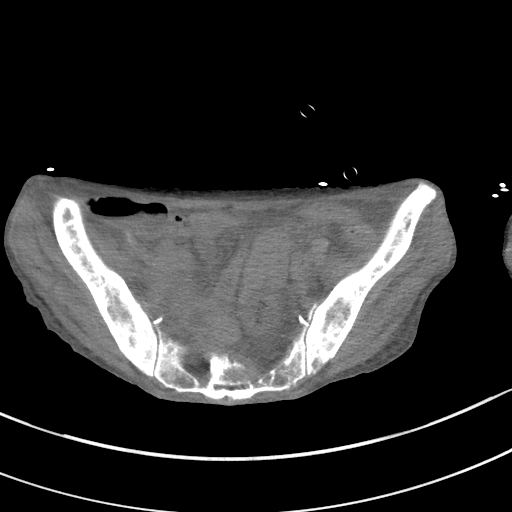
[im 59/88  soft-tissue]
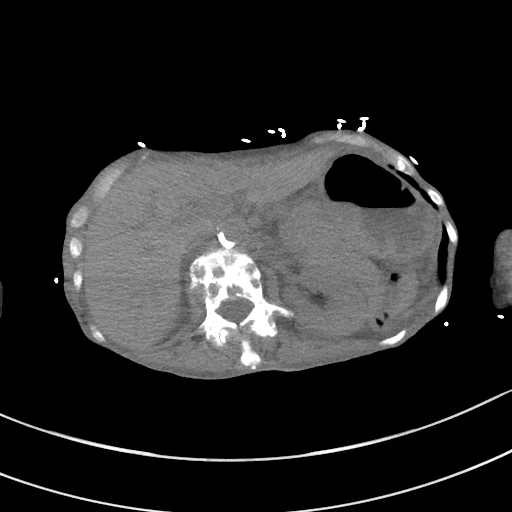

[Series 5: arterial · axial · arterial · 0.64mm/px · z∈[-487,-289]mm · 4 of 198 slices shown]
[im 33/198  soft-tissue]
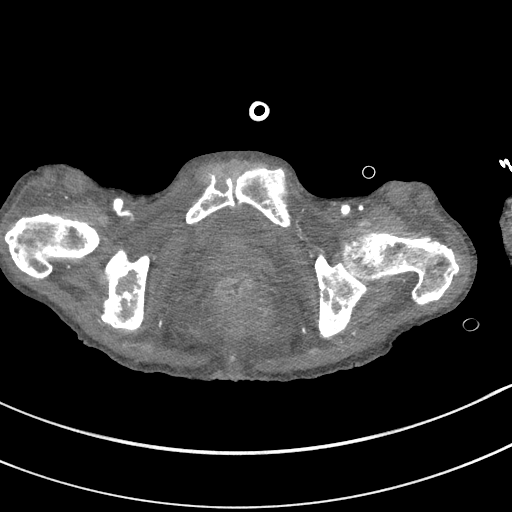
[im 66/198  soft-tissue]
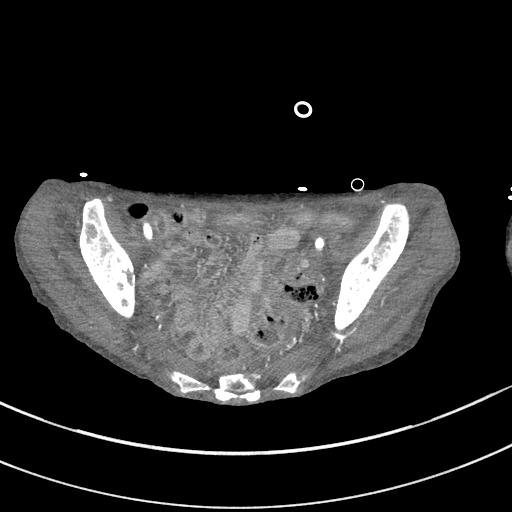
[im 99/198  soft-tissue]
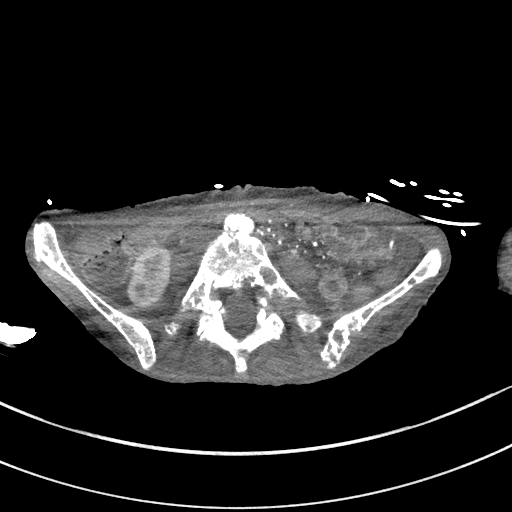
[im 132/198  soft-tissue]
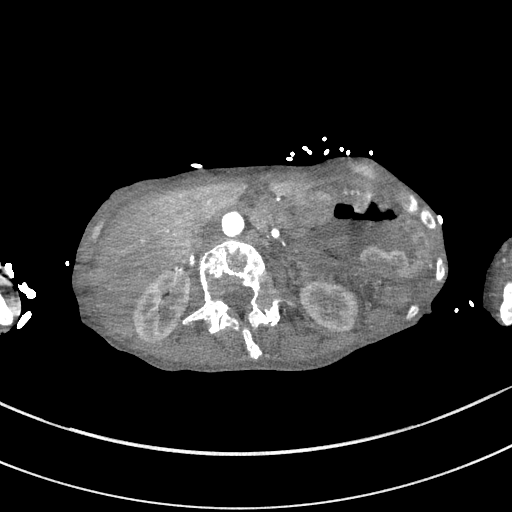

[Series 8: portal venous · axial · portal-venous · 0.62mm/px · z∈[-543,-123]mm · 3 of 85 slices shown, 7 images]
[im 1/85  soft-tissue]
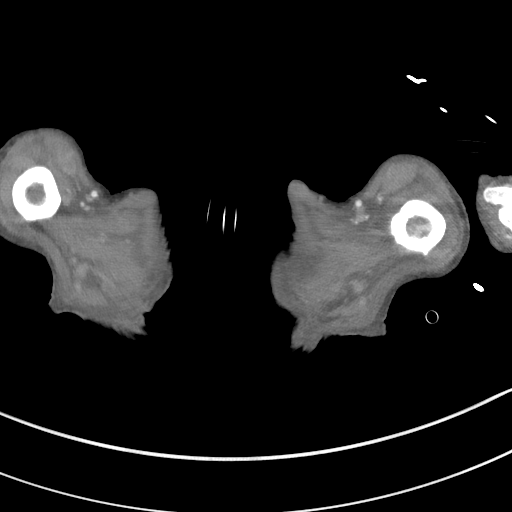
[im 1/85  lung]
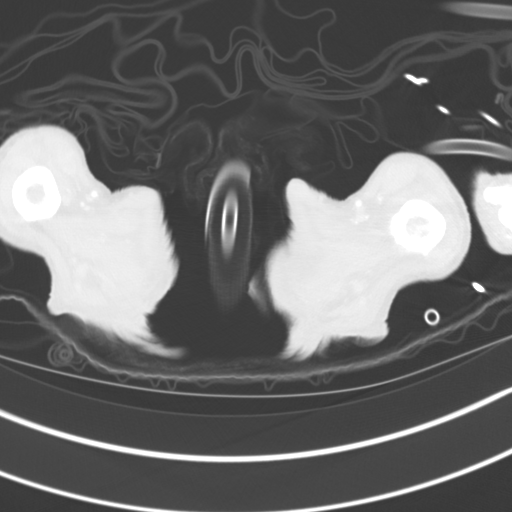
[im 1/85  bone]
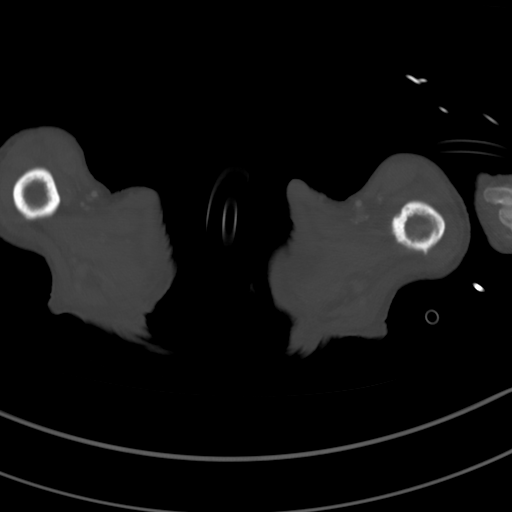
[im 43/85  soft-tissue]
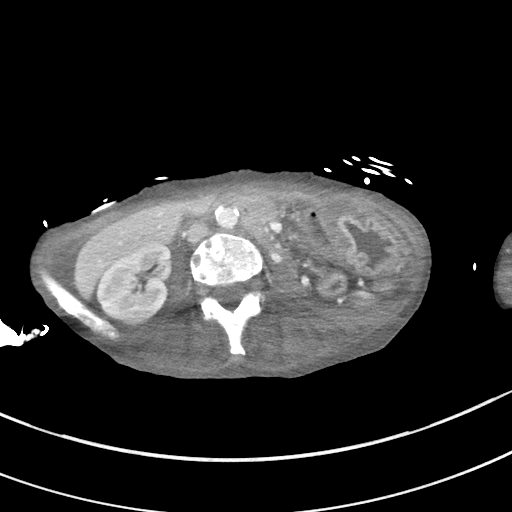
[im 43/85  lung]
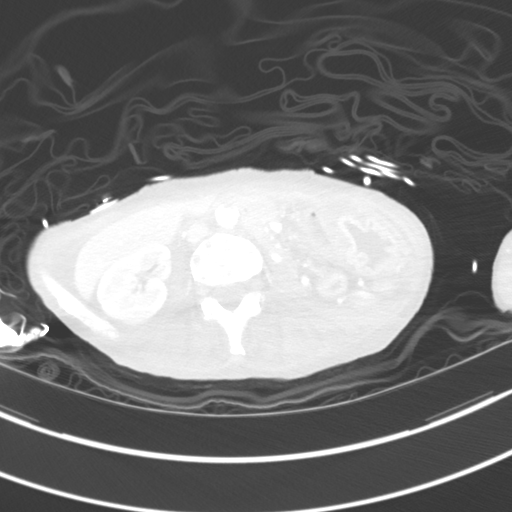
[im 85/85  soft-tissue]
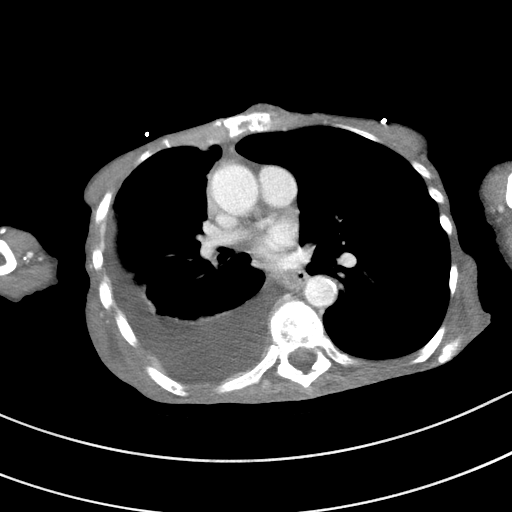
[im 85/85  lung]
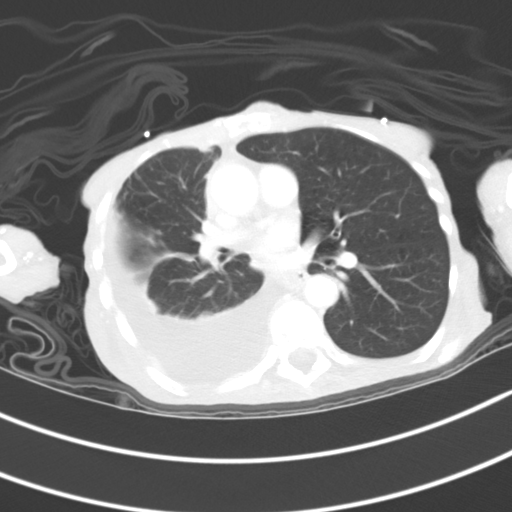

[Series 16: coronal · coronal · 0.63mm/px · 2 of 100 slices shown, 3 images]
[im 34/100  soft-tissue]
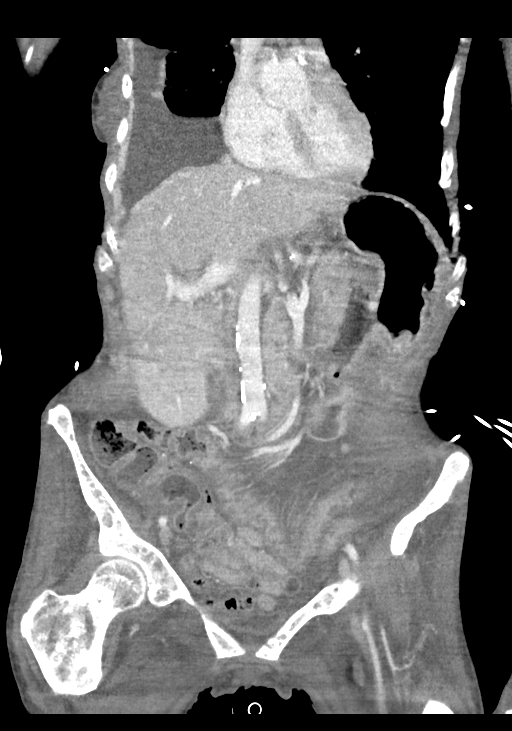
[im 34/100  bone]
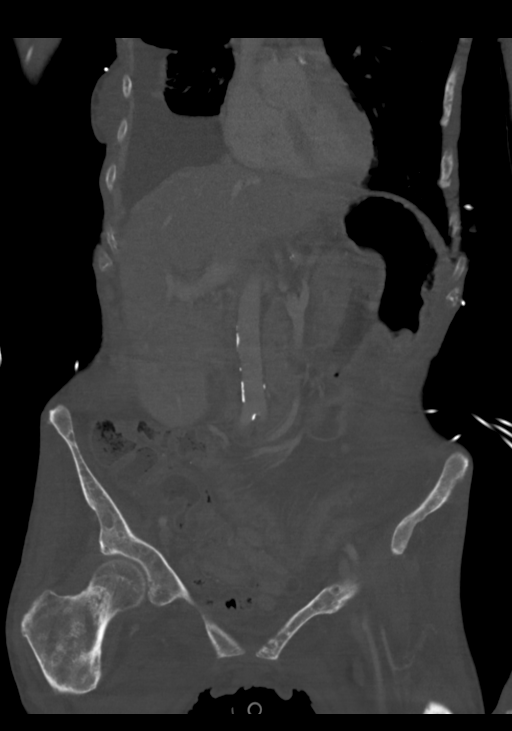
[im 67/100  soft-tissue]
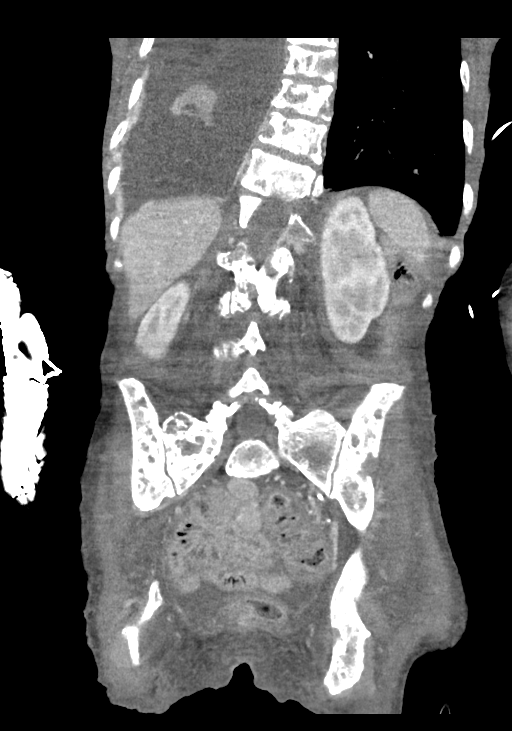

[11 of 46 positions shown; findings below may reference images not displayed]

FINDINGS: VASCULAR

Aorta: Normal caliber aorta without aneurysm, dissection, vasculitis
or significant stenosis.

Celiac: Patent without evidence of aneurysm, dissection, vasculitis
or significant stenosis.

SMA: Patent without evidence of aneurysm, dissection, vasculitis or
significant stenosis. Incidental replaced right hepatic artery off
the SMA.

Renals: Both renal arteries are patent without evidence of aneurysm,
dissection, vasculitis, fibromuscular dysplasia or significant
stenosis.

IMA: Patent without evidence of aneurysm, dissection, vasculitis or
significant stenosis.

Inflow: Patent without evidence of aneurysm, dissection, vasculitis
or significant stenosis.

Proximal Outflow: Bilateral common femoral and visualized portions
of the superficial and profunda femoral arteries are patent without
evidence of aneurysm, dissection, vasculitis or significant
stenosis.

Veins: No obvious venous abnormality within the limitations of this
arterial phase study.

Review of the MIP images confirms the above findings.

NON-VASCULAR

Lower chest: There is a large right pleural effusion with
compressive atelectasis of the right middle and right lower lobes.
Pleural nodularity is seen within the anterior right costophrenic
angle. Emphysematous changes are seen at the left lung base. There
is a spiculated nodule in the left lower lobe measuring 11 mm,
compatible with primary or metastatic malignancy.

Hepatobiliary: No focal liver abnormality is seen. Status post
cholecystectomy. No biliary dilatation.

Pancreas: Unremarkable. No pancreatic ductal dilatation or
surrounding inflammatory changes.

Spleen: Normal in size without focal abnormality.

Adrenals/Urinary Tract: Adrenal glands are unremarkable. Kidneys are
normal, without renal calculi, focal lesion, or hydronephrosis.
Bladder is decompressed which limits evaluation.

Stomach/Bowel: No bowel obstruction or ileus. I do not see any
contrast accumulation within the bowel lumen to suggest active
gastrointestinal bleeding.

Lymphatic: No pathologic adenopathy.

Reproductive: Uterus and bilateral adnexa are unremarkable.

Other: The patient is markedly cachectic. Mild diffuse subcutaneous
edema.

There is trace ascites.  No free intraperitoneal gas.

Musculoskeletal: There are extensive skeletal metastases throughout
all visualized bony structures, with numerous lytic lesions
identified. Large destructive lesion with associated soft tissue
mass seen at the lower sternal margin, soft tissue mass measuring
4.1 x 2.9 cm.
IMPRESSION: VASCULAR

1. No evidence of active gastrointestinal bleeding.
2. Extensive atherosclerosis.  No aortic aneurysm or dissection.

NON-VASCULAR

1. Extensive bony metastatic disease, with numerous destructive
lytic lesions. Largest lesion with associated soft tissue mass at
the lower sternal margin.
2. Large right pleural effusion, with associated pleural nodularity,
consistent with metastatic disease.
3. Spiculated 11 mm left lower lobe pulmonary nodule, either
metastatic or primary malignancy.
4. Marked cachexia.
5. Trace ascites.
